# Patient Record
Sex: Male | Born: 1970 | Race: White | Hispanic: No | Smoking: Current every day smoker
Health system: Southern US, Community
[De-identification: ages and names within clinical notes are randomized; demographics above are authoritative.]

## PROBLEM LIST (undated history)

## (undated) DIAGNOSIS — F319 Bipolar disorder, unspecified: Secondary | ICD-10-CM

## (undated) DIAGNOSIS — F4310 Post-traumatic stress disorder, unspecified: Secondary | ICD-10-CM

## (undated) DIAGNOSIS — F32A Depression, unspecified: Secondary | ICD-10-CM

## (undated) DIAGNOSIS — F419 Anxiety disorder, unspecified: Secondary | ICD-10-CM

## (undated) DIAGNOSIS — I219 Acute myocardial infarction, unspecified: Secondary | ICD-10-CM

## (undated) DIAGNOSIS — Z950 Presence of cardiac pacemaker: Secondary | ICD-10-CM

## (undated) DIAGNOSIS — F329 Major depressive disorder, single episode, unspecified: Secondary | ICD-10-CM

## (undated) DIAGNOSIS — J45909 Unspecified asthma, uncomplicated: Secondary | ICD-10-CM

## (undated) HISTORY — PX: PACEMAKER INSERTION: SHX728

---

## 2016-11-06 ENCOUNTER — Emergency Department (HOSPITAL_COMMUNITY)
Admission: EM | Admit: 2016-11-06 | Discharge: 2016-11-07 | Disposition: A | Discharge status: Other Acute Inpatient Hospital | Payer: No Typology Code available for payment source | Attending: Emergency Medicine | Admitting: Emergency Medicine

## 2016-11-06 ENCOUNTER — Other Ambulatory Visit: Payer: Self-pay

## 2016-11-06 ENCOUNTER — Encounter (HOSPITAL_COMMUNITY): Payer: Self-pay | Admitting: Neurology

## 2016-11-06 ENCOUNTER — Emergency Department (HOSPITAL_COMMUNITY): Payer: Self-pay

## 2016-11-06 DIAGNOSIS — F17200 Nicotine dependence, unspecified, uncomplicated: Secondary | ICD-10-CM | POA: Insufficient documentation

## 2016-11-06 DIAGNOSIS — J45909 Unspecified asthma, uncomplicated: Secondary | ICD-10-CM | POA: Insufficient documentation

## 2016-11-06 DIAGNOSIS — R0789 Other chest pain: Secondary | ICD-10-CM

## 2016-11-06 DIAGNOSIS — I252 Old myocardial infarction: Secondary | ICD-10-CM | POA: Insufficient documentation

## 2016-11-06 DIAGNOSIS — F332 Major depressive disorder, recurrent severe without psychotic features: Secondary | ICD-10-CM | POA: Diagnosis present

## 2016-11-06 DIAGNOSIS — R45851 Suicidal ideations: Secondary | ICD-10-CM

## 2016-11-06 DIAGNOSIS — Z79899 Other long term (current) drug therapy: Secondary | ICD-10-CM | POA: Insufficient documentation

## 2016-11-06 DIAGNOSIS — Z950 Presence of cardiac pacemaker: Secondary | ICD-10-CM | POA: Insufficient documentation

## 2016-11-06 DIAGNOSIS — F329 Major depressive disorder, single episode, unspecified: Secondary | ICD-10-CM | POA: Insufficient documentation

## 2016-11-06 DIAGNOSIS — R079 Chest pain, unspecified: Secondary | ICD-10-CM

## 2016-11-06 HISTORY — DX: Presence of cardiac pacemaker: Z95.0

## 2016-11-06 HISTORY — DX: Bipolar disorder, unspecified: F31.9

## 2016-11-06 HISTORY — DX: Acute myocardial infarction, unspecified: I21.9

## 2016-11-06 HISTORY — DX: Anxiety disorder, unspecified: F41.9

## 2016-11-06 HISTORY — DX: Major depressive disorder, single episode, unspecified: F32.9

## 2016-11-06 HISTORY — DX: Post-traumatic stress disorder, unspecified: F43.10

## 2016-11-06 HISTORY — DX: Unspecified asthma, uncomplicated: J45.909

## 2016-11-06 HISTORY — DX: Depression, unspecified: F32.A

## 2016-11-06 LAB — RAPID URINE DRUG SCREEN, HOSP PERFORMED
Amphetamines: NOT DETECTED
BARBITURATES: NOT DETECTED
Benzodiazepines: NOT DETECTED
Cocaine: NOT DETECTED
Opiates: NOT DETECTED
TETRAHYDROCANNABINOL: NOT DETECTED

## 2016-11-06 LAB — ETHANOL

## 2016-11-06 LAB — CBC
HCT: 48.3 % (ref 39.0–52.0)
Hemoglobin: 15.7 g/dL (ref 13.0–17.0)
MCH: 30 pg (ref 26.0–34.0)
MCHC: 32.5 g/dL (ref 30.0–36.0)
MCV: 92.2 fL (ref 78.0–100.0)
Platelets: 183 10*3/uL (ref 150–400)
RBC: 5.24 MIL/uL (ref 4.22–5.81)
RDW: 13.9 % (ref 11.5–15.5)
WBC: 10 10*3/uL (ref 4.0–10.5)

## 2016-11-06 LAB — COMPREHENSIVE METABOLIC PANEL
ALT: 74 U/L — ABNORMAL HIGH (ref 17–63)
AST: 67 U/L — ABNORMAL HIGH (ref 15–41)
Albumin: 4.3 g/dL (ref 3.5–5.0)
Alkaline Phosphatase: 126 U/L (ref 38–126)
Anion gap: 7 (ref 5–15)
BILIRUBIN TOTAL: 0.6 mg/dL (ref 0.3–1.2)
BUN: 13 mg/dL (ref 6–20)
CO2: 29 mmol/L (ref 22–32)
CREATININE: 0.65 mg/dL (ref 0.61–1.24)
Calcium: 10.4 mg/dL — ABNORMAL HIGH (ref 8.9–10.3)
Chloride: 106 mmol/L (ref 101–111)
Glucose, Bld: 91 mg/dL (ref 65–99)
POTASSIUM: 5.4 mmol/L — AB (ref 3.5–5.1)
Sodium: 142 mmol/L (ref 135–145)
TOTAL PROTEIN: 7.9 g/dL (ref 6.5–8.1)

## 2016-11-06 LAB — ACETAMINOPHEN LEVEL: Acetaminophen (Tylenol), Serum: <10 ug/mL — ABNORMAL LOW (ref 10–30)

## 2016-11-06 LAB — I-STAT TROPONIN, ED
TROPONIN I, POC: 0 ng/mL (ref 0.00–0.08)
Troponin i, poc: 0 ng/mL (ref 0.00–0.08)

## 2016-11-06 LAB — SALICYLATE LEVEL

## 2016-11-06 MED ORDER — ASPIRIN 81 MG PO CHEW
81.0000 mg | CHEWABLE_TABLET | Freq: Every day | ORAL | Status: DC
  Administered 2016-11-07 (×2): 81 mg via ORAL
  Filled 2016-11-06 (×3): qty 1

## 2016-11-06 NOTE — ED Triage Notes (Signed)
Report given to EDP Schlossman. 

## 2016-11-06 NOTE — ED Triage Notes (Signed)
Meal ordered

## 2016-11-06 NOTE — ED Notes (Signed)
Staffing made aware of need for sitter 

## 2016-11-06 NOTE — ED Triage Notes (Signed)
Per ems- [facility] SI with plan to jump off bridge and is having chest wall pain. Chest pain when taking deep breath. Has pacemaker, and is supposed to be pacing but is not. 6/10 CP. VSS

## 2016-11-06 NOTE — ED Notes (Signed)
Pacemaker monitors called to verify why pacemaker was turned off. They will fax over information about his pacemaker. December 31st was last arhythmia

## 2016-11-06 NOTE — BHH Counselor (Signed)
Spoke with Dr. Dalene SeltzerSchlossman, EDP, at [facility]: Advised her of recommendation. She sts she agrees. TTS will seek outside placement.  Beryle FlockMary Georgina Krist, MS, CRC, [facility] Triage Specialist [facility]
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
## 2016-11-06 NOTE — BH Assessment (Signed)
Under Review: Lanny HurstBrynn Mar, Earlene Plateravis, [address], [place], [place], [facility], [place]

## 2016-11-07 ENCOUNTER — Inpatient Hospital Stay (HOSPITAL_COMMUNITY)
Admission: AD | Admit: 2016-11-07 | Discharge: 2016-11-20 | DRG: 885 | Disposition: A | Discharge status: Home / Self Care | Payer: No Typology Code available for payment source | Source: Intra-hospital | Attending: Psychiatry | Admitting: Psychiatry

## 2016-11-07 ENCOUNTER — Encounter (HOSPITAL_COMMUNITY): Payer: Self-pay

## 2016-11-07 DIAGNOSIS — J4520 Mild intermittent asthma, uncomplicated: Secondary | ICD-10-CM | POA: Diagnosis present

## 2016-11-07 DIAGNOSIS — F332 Major depressive disorder, recurrent severe without psychotic features: Secondary | ICD-10-CM

## 2016-11-07 DIAGNOSIS — F17210 Nicotine dependence, cigarettes, uncomplicated: Secondary | ICD-10-CM | POA: Diagnosis present

## 2016-11-07 DIAGNOSIS — Z79899 Other long term (current) drug therapy: Secondary | ICD-10-CM | POA: Diagnosis not present

## 2016-11-07 DIAGNOSIS — R45851 Suicidal ideations: Secondary | ICD-10-CM | POA: Diagnosis present

## 2016-11-07 DIAGNOSIS — I252 Old myocardial infarction: Secondary | ICD-10-CM

## 2016-11-07 DIAGNOSIS — F419 Anxiety disorder, unspecified: Secondary | ICD-10-CM | POA: Diagnosis present

## 2016-11-07 DIAGNOSIS — F4310 Post-traumatic stress disorder, unspecified: Secondary | ICD-10-CM | POA: Diagnosis present

## 2016-11-07 DIAGNOSIS — Z590 Homelessness: Secondary | ICD-10-CM

## 2016-11-07 DIAGNOSIS — Z7982 Long term (current) use of aspirin: Secondary | ICD-10-CM

## 2016-11-07 DIAGNOSIS — Z885 Allergy status to narcotic agent status: Secondary | ICD-10-CM

## 2016-11-07 DIAGNOSIS — Z886 Allergy status to analgesic agent status: Secondary | ICD-10-CM

## 2016-11-07 DIAGNOSIS — Z62810 Personal history of physical and sexual abuse in childhood: Secondary | ICD-10-CM | POA: Diagnosis present

## 2016-11-07 DIAGNOSIS — Z888 Allergy status to other drugs, medicaments and biological substances status: Secondary | ICD-10-CM | POA: Diagnosis not present

## 2016-11-07 DIAGNOSIS — G4700 Insomnia, unspecified: Secondary | ICD-10-CM | POA: Diagnosis present

## 2016-11-07 MED ORDER — HYDROXYZINE HCL 25 MG PO TABS
25.0000 mg | ORAL_TABLET | Freq: Three times a day (TID) | ORAL | Status: DC | PRN
  Administered 2016-11-08 – 2016-11-19 (×17): 25 mg via ORAL
  Filled 2016-11-07 (×20): qty 1

## 2016-11-07 MED ORDER — ALUM & MAG HYDROXIDE-SIMETH 200-200-20 MG/5ML PO SUSP
30.0000 mL | ORAL | Status: DC | PRN
Start: 2016-11-07 — End: 2016-11-20

## 2016-11-07 MED ORDER — NICOTINE 14 MG/24HR TD PT24
14.0000 mg | MEDICATED_PATCH | Freq: Once | TRANSDERMAL | Status: DC
  Administered 2016-11-07: 14 mg via TRANSDERMAL
  Filled 2016-11-07: qty 1

## 2016-11-07 MED ORDER — NICOTINE 21 MG/24HR TD PT24
21.0000 mg | MEDICATED_PATCH | Freq: Every day | TRANSDERMAL | Status: DC
  Administered 2016-11-07 – 2016-11-17 (×11): 21 mg via TRANSDERMAL
  Filled 2016-11-07 (×15): qty 1

## 2016-11-07 MED ORDER — MAGNESIUM HYDROXIDE 400 MG/5ML PO SUSP: 30.0000 mL | Freq: Every day | ORAL | Status: DC | PRN

## 2016-11-07 NOTE — ED Notes (Signed)
Regular diet was ordered for patient. 

## 2016-11-07 NOTE — Progress Notes (Signed)
D: Patient seen awake watching TV. Expresses feelings of wellness and belongingness after receiving call from father, mother, sister who he has not spoken with for the past 5 years, and girlfriend who left him. Verbalizes no concern. Denies pain, SI/HI, AH/VH at this time. No behavioral issues noted.   A: Staff offered support and encourage patient to continue with the treatment plan and verbalize needs to staff. Constant observation maintained except when patient is in the bathroom. Will continue to monitor patient.   R: Patient remains safe.

## 2016-11-07 NOTE — Congregational Nurse Program (Signed)
Congregational Nurse Program Note  Date of Encounter: 11/06/2016  Past Medical History: Past Medical History:  Diagnosis Date  . Anxiety   . Asthma   . Bipolar disorder (HCC)   . Depression   . MI (myocardial infarction)   . Pacemaker   . PTSD (post-traumatic stress disorder)     Encounter Details:     CNP Questionnaire - 11/06/16 1138      Patient Demographics   Is this a new or existing patient? New   Patient is considered a/an Not Applicable   Race Caucasian/White     Patient Assistance   Location of Patient Assistance Not Applicable   Patient's financial/insurance status Low Income;Self-Pay (Uninsured)   Uninsured Patient ([employer]ange Research officer, trade unionCard/Care Connects) Yes   Interventions Referred to ED/Urgent Care   Patient referred to apply for the following financial assistance Not Applicable   Food insecurities addressed Not Applicable   Transportation assistance No   Assistance securing medications No   Educational health offerings Behavioral health;Cardiac disease;Navigating the healthcare system     Encounter Details   Primary purpose of visit Chronic Illness/Condition Visit;Navigating the Healthcare System;Spiritual Care/Support Visit   Was an Emergency Department visit averted? No   Does patient have a medical provider? No   Patient referred to Emergency Department;Doctor referral for an emergent behavioral health crisis   Was a mental health screening completed? (GAINS tool) Yes   Was a mental health referral made? Yes   Does patient have dental issues? No   Does patient have vision issues? No   Does your patient have an abnormal blood pressure today? No   Since previous encounter, have you referred patient for abnormal blood pressure that resulted in a new diagnosis or medication change? No   Does your patient have an abnormal blood glucose today? No   Since previous encounter, have you referred patient for abnormal blood glucose that resulted in a new diagnosis or  medication change? No   Was there a life-saving intervention made? Yes     client was brought to me (behavioral health nurse) [facility].  Client was tearful and stating he wanted to die.  Assessment:  Tearful, states "I just can't do this anymore" referring to his chronic homelessness.  There were no shelter beds and his "[facility].  Client states he has "lost everything" and "nothing matters".  Sucidality:  States on a scale of 1-10 with 10 being the most likely to follow through with attempt he stated an "8".  He admitted to a plan of jumping off [address].  Has had suicide attempts in the past with hospitalizations.  Has been on medications:  Celexa and Visteril, however has not taken for about two years.  States has multiple cardiac issues including a pacemaker.  States was told by EMS a week ago he had had a heart attack, but he refused to go the hospital.  States has has chest pain since.  Client agreed to having EMS called and be transported to the Ed.  EMS [facility]

## 2016-11-07 NOTE — ED Notes (Signed)
Regular Diet has been ordered for Lunch. 

## 2016-11-07 NOTE — ED Provider Notes (Signed)
Vitals stable this am.  Plan on transfer to [facility]   Linwood DibblesJon Siyana Erney, MD 11/07/16 1125

## 2016-11-07 NOTE — ED Notes (Signed)
Pt belongings locked in cabinet. Inventory sheet placed in pt's chart

## 2016-11-07 NOTE — ED Notes (Signed)
Pt spoke w/step-father on phone - advised [facility]. Advised pt to [facility] so they may call and ask them to hold bed and if d/c'd tomorrow, he may go there.

## 2016-11-07 NOTE — ED Notes (Signed)
Pt aware Geoffrey Taylor, SW, [facility] aware of [facility] bed [facility] him.

## 2016-11-07 NOTE — ED Provider Notes (Signed)
[facility]-EM[facility]ENCY DEPT Provider Note   CSN: [id number] Arrival date & time: 11/06/16  1322     History   Chief Complaint Chief Complaint  Patient presents with  . Suicidal  . Chest Pain    HPI Gevork Ayyad is a 46 y.o. male.  HPI   Presents with concern for suicidal ideation. Reports that he's been under a lot of stress recently.  Reports he's had prior suicide attempts in the past. Reports that his planned this time would be to jump off of a bridge, and reports that he knows himself, and that he would do this. He also reports he's had chest pain that he describes as a dull ache across his whole chest, that has been intermittent over the last week. It is worse with taking a deep breath and movement. Denies shortness of breath, nausea, diaphoresis. Reports he's had pain in his bilateral lower extremities.  Past Medical History:  Diagnosis Date  . Anxiety   . Asthma   . Bipolar disorder (HCC)   . Depression   . MI (myocardial infarction)   . Pacemaker   . PTSD (post-traumatic stress disorder)     There are no active problems to display for this patient.   Past Surgical History:  Procedure Laterality Date  . PACEMAKER INSERTION         Home Medications    Prior to Admission medications   Not on File    Family History No family history on file.  Social History Social History  Substance Use Topics  . Smoking status: Current Every Day Smoker  . Smokeless tobacco: Never Used  . Alcohol use No     Allergies   Dilaudid [hydromorphone hcl]; Ibuprofen; and Vicodin [hydrocodone-acetaminophen]   Review of Systems Review of Systems  Constitutional: Negative for fever.  HENT: Negative for sore throat.   Eyes: Negative for visual disturbance.  Respiratory: Negative for shortness of breath.   Cardiovascular: Positive for chest pain.  Gastrointestinal: Negative for abdominal pain, nausea and vomiting.  Genitourinary: Negative for difficulty urinating.    Musculoskeletal: Negative for back pain and neck stiffness.  Skin: Negative for rash.  Neurological: Negative for syncope and headaches.  Psychiatric/Behavioral: Positive for suicidal ideas.     Physical Exam Updated Vital Signs BP 113/60 (BP Location: Right Arm)   Pulse 60   Temp 97.5 F (36.4 C) (Oral)   Resp 16   Ht 5' 6.5" (1.689 m)   Wt 158 lb (71.7 kg)   SpO2 97%   BMI 25.12 kg/m   Physical Exam  Constitutional: He is oriented to person, place, and time. He appears well-developed and well-nourished. No distress.  HENT:  Head: Normocephalic and atraumatic.  Eyes: Conjunctivae and EOM are normal.  Neck: Normal range of motion.  Cardiovascular: Normal rate, regular rhythm, normal heart sounds and intact distal pulses.  Exam reveals no gallop and no friction rub.   No murmur heard. Pulmonary/Chest: Effort normal and breath sounds normal. No respiratory distress. He has no wheezes. He has no rales. He exhibits tenderness.  Abdominal: Soft. He exhibits no distension. There is no tenderness. There is no guarding.  Musculoskeletal: He exhibits no edema.  Neurological: He is alert and oriented to person, place, and time.  Skin: Skin is warm and dry. He is not diaphoretic.  Nursing note and vitals reviewed.    ED Treatments / Results  Labs (all labs ordered are listed, but only abnormal results are displayed) Labs Reviewed  COMPREHENSIVE METABOLIC PANEL -  Abnormal; Notable for the following:       Result Value   Potassium 5.4 (*)    Calcium 10.4 (*)    AST 67 (*)    ALT 74 (*)    All other components within normal limits  ACETAMINOPHEN LEVEL - Abnormal; Notable for the following:    Acetaminophen (Tylenol), Serum <10 (*)    All other components within normal limits  CBC  ETHANOL  SALICYLATE LEVEL  RAPID URINE DRUG SCREEN, HOSP PERFORMED  I-STAT TROPOININ, ED  I-STAT TROPOININ, ED    EKG  EKG Interpretation  Date/Time:  Monday November 06 2016 13:21:33  EST Ventricular Rate:  68 PR Interval:  250 QRS Duration: 112 QT Interval:  374 QTC Calculation: 397 R Axis:   36 Text Interpretation:  Sinus rhythm with 1st degree A-V block Nonspecific ST abnormality Abnormal ECG No previous ECGs available Confirmed by [facility] MD, Denny PeonERIN ([id number]) on 11/06/2016 6:10:15 PM Also confirmed by [facility], Vinh Sachs ([id number])  on 11/07/2016 2:08:35 AM       Radiology Dg Chest 2 View  Result Date: 11/06/2016 CLINICAL DATA:  Central chest pain. EXAM: CHEST  2 VIEW COMPARISON:  None. FINDINGS: Trachea is midline. Pacemaker lead tips are in the right atrium and right ventricle. Lungs are clear. No pleural fluid. IMPRESSION: No acute findings. Electronically Signed   By: Leanna BattlesMelinda  Blietz M.D.   On: 11/06/2016 14:00    Procedures Procedures (including critical care time)  Medications Ordered in ED Medications  aspirin chewable tablet 81 mg (81 mg Oral Given 11/07/16 0039)  nicotine (NICODERM CQ - dosed in mg/24 hours) patch 14 mg (14 mg Transdermal Patch Applied 11/07/16 0052)     Initial Impression / Assessment and Plan / ED Course  I have reviewed the triage vital signs and the nursing notes.  Pertinent labs & imaging results that were available during my care of the patient were reviewed by me and considered in my medical decision making (see chart for details).     46 year old male with a history of MI, pacemaker placement, PTSD, depression asthma and bipolar presents with concern for suicidal ideation and chest pain. Patient also reports pain in his bilateral lower extremities. He has good pulses bilaterally, no asymmetric edema, not low suspicion for acute venous or arterial thrombus. There is no sign of cellulitis. Patient is perk negative, and have low suspicion for pulmonary embolus. His EKG without significant findings, and troponin 2 are negative. Patient's biggest concern today is his increasing suicidal ideation. His chest pain is overall atypical,  however recommend outpatient follow-up with his cardiologist.  Patient is medically cleared for psychiatric evaluation. TTS was consulted and recommended inpatient placement. Patient is voluntary, however would consider ivc if he would like to leave.  Final Clinical Impressions(s) / ED Diagnoses   Final diagnoses:  Suicidal ideation  Chest wall pain  Chest pain, unspecified type    New Prescriptions New Prescriptions   No medications on file     Alvira MondayErin Kerith Sherley, MD 11/07/16 [phone]

## 2016-11-07 NOTE — Progress Notes (Signed)
[facility] at 1250 ambulatory and alert. Pt presents in a calm and pleasant mood. States that he is homeless and depressed because his girlfriend left him to go live in [place] her son because she was tired of being homeless. Pt states that his plan is to jump off of a bridge. He endorses AH with voices telling him to kill himself and that he is worthless. He does admit to being very violent in the past but denies HI at this time. Also denies VH At this time pt is sleeping between intervals without incident. He is able to contract for safety.
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
## 2016-11-08 ENCOUNTER — Encounter (HOSPITAL_COMMUNITY): Payer: Self-pay

## 2016-11-08 DIAGNOSIS — F332 Major depressive disorder, recurrent severe without psychotic features: Secondary | ICD-10-CM | POA: Diagnosis not present

## 2016-11-08 DIAGNOSIS — Z79899 Other long term (current) drug therapy: Secondary | ICD-10-CM | POA: Diagnosis not present

## 2016-11-08 DIAGNOSIS — Z888 Allergy status to other drugs, medicaments and biological substances status: Secondary | ICD-10-CM

## 2016-11-08 DIAGNOSIS — F17210 Nicotine dependence, cigarettes, uncomplicated: Secondary | ICD-10-CM | POA: Diagnosis not present

## 2016-11-08 DIAGNOSIS — R45851 Suicidal ideations: Secondary | ICD-10-CM

## 2016-11-08 MED ORDER — HYDROXYZINE HCL 25 MG PO TABS: 25.0000 mg | ORAL_TABLET | Freq: Four times a day (QID) | ORAL | 0 refills | Status: DC | PRN

## 2016-11-08 MED ORDER — CITALOPRAM HYDROBROMIDE 10 MG PO TABS: 10.0000 mg | ORAL_TABLET | Freq: Every day | ORAL | 0 refills | Status: DC

## 2016-11-08 MED ORDER — ASPIRIN EC 81 MG PO TBEC: 81.0000 mg | DELAYED_RELEASE_TABLET | Freq: Every day | ORAL | 0 refills | Status: DC

## 2016-11-08 MED ORDER — NICOTINE 21 MG/24HR TD PT24: 21.0000 mg | MEDICATED_PATCH | Freq: Every day | TRANSDERMAL | 0 refills | Status: DC

## 2016-11-08 NOTE — Progress Notes (Signed)
11/08/2016  [facility]  Observation staff have made attempts to contact [facility] for bed placement for patient without success. [facility]. Pt and staff will continue [facility]. Pt will be re-assessed on 11/09/2016.  Laveda AbbeLaurie Britton Kainon Varady MSN, FNP [facility] 5:28PM

## 2016-11-08 NOTE — Progress Notes (Signed)
D:  Patient awake and alert; oriented x 4; he denies suicidal and homicidal ideation and AVH; no self-injurious behaviors noted or reported. A:  Medications given as scheduled;  Emotional support provided; encouraged him to seek assistance with needs/concerns. R:  Safety maintained on unit. 

## 2016-11-08 NOTE — Progress Notes (Signed)
[facility] OBSERVATION UNIT:  Family/Significant Other Suicide Prevention Education  Suicide Prevention Education:  Patient Refusal for Family/Significant Other Suicide Prevention Education: The patient Geoffrey Taylor has refused to provide written consent for family/significant other to be provided Family/Significant Other Suicide Prevention Education during admission and/or prior to discharge.  Physician notified.  Camelia EngKaren H Merlie Noga 11/08/2016, 9:17 AM   Camelia EngKaren H Rollins Wrightson, RN 11/08/16  9:17 AM

## 2016-11-08 NOTE — Discharge Summary (Signed)
[facility] Observation Unit Discharge Summary Note  Patient:  Geoffrey Taylor is an 46 y.o., male MRN:  [id number] DOB:  [birth year] Patient phone:  [phone] (home)  Patient address:   [address] [place] [address],  Total Time spent with patient: 20 minutes  Date of Admission:  11/07/2016 Date of Discharge: 11/08/2016  Reason for Admission:  Geoffrey Taylor is a 46 year old male who presented to the [facility], voluntarily, with suicidal ideation with a plan to jump off a bridge.   Principal Problem: MDD (major depressive disorder), recurrent severe, without psychosis [facility]) Discharge Diagnoses: Patient Active Problem List   Diagnosis Date Noted  . MDD (major depressive disorder), recurrent severe, without psychosis ([facility]) [F33.2] 11/07/2016    Priority: High    Past Psychiatric History: MDD, anxiety, PTSD,   Past Medical History:  Past Medical History:  Diagnosis Date  . Anxiety   . Asthma   . Bipolar disorder ([facility])   . Depression   . MI (myocardial infarction)   . Pacemaker   . PTSD (post-traumatic stress disorder)     Past Surgical History:  Procedure Laterality Date  . PACEMAKER INSERTION     Family History: History reviewed. No pertinent family history. Family Psychiatric  History: Unknown Social History:  History  Alcohol Use No     History  Drug Use No    Social History   Social History  . Marital status: Unknown    Spouse name: N/A  . Number of children: N/A  . Years of education: N/A   Occupational History  . Unemployed    Social History Main Topics  . Smoking status: Current Every Day Smoker    Packs/day: 1.00    Types: Cigarettes  . Smokeless tobacco: Never Used     Comment: Patient refused  . Alcohol use No  . Drug use: No  . Sexual activity: Not Currently    Partners: Female   Other Topics Concern  . None   Social History Narrative   Pt homeless at this time. Depressed over the fact that his girlfriend moved to [place] to live with her son  because she was also homeless here in [place] as well.    Hospital Course:  Pt spent the night in [facility] Observation unit without incident. Today,  Pt denies suicidal/homicidal ideation, denies auditory/visual hallucinations and does not appear to be responding to internal stimuli. Pt was calm and cooperative, alert & oriented x 4, dressed in paper scrubs and lying on the bed. Pt stated he felt hopeless and helpless and thought about ending it all but the more he thought about it he realized that suicide wasn't the answer. Pt states "My wife left me a week ago to go live with a family member because she is tired of being homeless. She is in [facility]. I didn't think there was anyone who cared about me but all of my family members have called me since I came to the hospital and they want to support me in getting help. I have a bed at the [facility] waiting on me which I hope works out and I can go there. " Pt appears motivated for change.    Physical Findings: AIMS: Facial and Oral Movements Muscles of Facial Expression: None, normal Lips and Perioral Area: None, normal Jaw: None, normal Tongue: None, normal,Extremity Movements Upper (arms, wrists, hands, fingers): None, normal Lower (legs, knees, ankles, toes): None, normal, Trunk Movements Neck, shoulders, hips: None, normal, Overall Severity Severity of abnormal  movements (highest score from questions above): None, normal Incapacitation due to abnormal movements: None, normal Patient's awareness of abnormal movements (rate only patient's report): No Awareness, Dental Status Current problems with teeth and/or dentures?: No Does patient usually wear dentures?: No  CIWA:    COWS:     Musculoskeletal: Strength & Muscle Tone: within normal limits Gait & Station: normal Patient leans: N/A  Psychiatric Specialty Exam: Physical Exam  Constitutional: He is oriented to person, place, and time. He appears well-developed and well-nourished.   Musculoskeletal: Normal range of motion.  Neurological: He is alert and oriented to person, place, and time.  Skin: Skin is warm and dry.    Review of Systems  Psychiatric/Behavioral: Positive for depression and suicidal ideas. Negative for hallucinations, memory loss and substance abuse. The patient is nervous/anxious. The patient does not have insomnia.   All other systems reviewed and are negative.   Blood pressure 108/69, pulse 72, temperature 98.4 F (36.9 C), temperature source Oral, resp. rate 16, height 5\' 7"  (1.702 m), weight 72.6 kg (160 lb), SpO2 99 %.Body mass index is 25.06 kg/m.  General Appearance: Casual  Eye Contact:  Good  Speech:  Clear and Coherent and Normal Rate  Volume:  Normal  Mood:  Anxious, Depressed, Hopeless and Worthless  Affect:  Appropriate, Congruent and Depressed  Thought Process:  Coherent, Goal Directed and Linear  Orientation:  Full (Time, Place, and Person)  Thought Content:  Logical  Suicidal Thoughts:  Yes.  without intent/plan  Homicidal Thoughts:  No  Memory:  Immediate;   Good Recent;   Good Remote;   Fair  Judgement:  Good  Insight:  Good  Psychomotor Activity:  Normal  Concentration:  Concentration: Good and Attention Span: Good  Recall:  Good  Fund of Knowledge:  Good  Language:  Good  Akathisia:  No  Handed:  Right  AIMS (if indicated):     Assets:  Communication Skills Desire for Improvement Housing Physical Health Resilience Social Support  ADL's:  Intact  Cognition:  WNL  Sleep:        Have you used any form of tobacco in the last 30 days? (Cigarettes, Smokeless Tobacco, Cigars, and/or Pipes): Yes  Has this patient used any form of tobacco in the last 30 days? (Cigarettes, Smokeless Tobacco, Cigars, and/or Pipes) Yes,Prescription provided for Nicoderm patches  Blood Alcohol level:  Lab Results  Component Value Date   ETH <5 11/06/2016    Metabolic Disorder Labs:  No results found for: HGBA1C, MPG No results  found for: PROLACTIN No results found for: CHOL, TRIG, HDL, CHOLHDL, VLDL, LDLCALC  See Psychiatric Specialty Exam and Suicide Risk Assessment completed by Attending Physician prior to discharge.  Discharge destination:  Other:  Holiday representative  Is patient on multiple antipsychotic therapies at discharge:  No   Has Patient had three or more failed trials of antipsychotic monotherapy by history:  No  Recommended Plan for Multiple Antipsychotic Therapies: NA   Allergies as of 11/08/2016      Reactions   Dilaudid [hydromorphone Hcl] Nausea And Vomiting   Ibuprofen Nausea And Vomiting   Vicodin [hydrocodone-acetaminophen] Nausea And Vomiting      Medication List    TAKE these medications     Indication  aspirin EC 81 MG tablet Take 1 tablet (81 mg total) by mouth daily.  Indication:  Acute Heart Attack   citalopram 10 MG tablet Commonly known as:  CELEXA Take 1 tablet (10 mg total) by mouth daily.  Indication:  Depression   hydrOXYzine 25 MG tablet Commonly known as:  ATARAX/VISTARIL Take 1 tablet (25 mg total) by mouth every 6 (six) hours as needed for anxiety.  Indication:  Anxiety Neurosis   nicotine 21 mg/24hr patch Commonly known as:  NICODERM CQ - dosed in mg/24 hours Place 1 patch (21 mg total) onto the skin daily. Start taking on:  11/09/2016  Indication:  Nicotine Addiction        Follow-up recommendations:  Activity:  as tolerated Diet:  Regular Other:  Follow up with outpatient resources for psychiatry and therapy and medication management [facility] Establish a relationship with A PCP when able for any existing or new medical problems/issues  Comments:  [facility]  Addendum: [facility]. Discharge order was discontinued and [facility] until 11/09/2016 at which time he will be discharged.  Signed: Laveda AbbeLaurie Britton Anthone Prieur,  NP 11/08/2016, 5:24 PM

## 2016-11-08 NOTE — Progress Notes (Signed)
DAR NOTE: Patient seen awake watching TV during the shift change. Verbalizes no concern. Denies pain, SI/HI, AH/VH at this time. No behavioral issues noted. Looking forward for his discharge tomorrow. Constant observation maintained except when patient is in the bathroom. Will continue to monitor patient. Patient remains safe.

## 2016-11-09 DIAGNOSIS — Z62810 Personal history of physical and sexual abuse in childhood: Secondary | ICD-10-CM | POA: Diagnosis present

## 2016-11-09 DIAGNOSIS — F332 Major depressive disorder, recurrent severe without psychotic features: Secondary | ICD-10-CM | POA: Diagnosis present

## 2016-11-09 DIAGNOSIS — R45851 Suicidal ideations: Secondary | ICD-10-CM | POA: Diagnosis not present

## 2016-11-09 DIAGNOSIS — Z79899 Other long term (current) drug therapy: Secondary | ICD-10-CM | POA: Diagnosis not present

## 2016-11-09 DIAGNOSIS — Z888 Allergy status to other drugs, medicaments and biological substances status: Secondary | ICD-10-CM | POA: Diagnosis not present

## 2016-11-09 DIAGNOSIS — F4310 Post-traumatic stress disorder, unspecified: Secondary | ICD-10-CM | POA: Diagnosis present

## 2016-11-09 DIAGNOSIS — Z950 Presence of cardiac pacemaker: Secondary | ICD-10-CM | POA: Diagnosis not present

## 2016-11-09 DIAGNOSIS — F17210 Nicotine dependence, cigarettes, uncomplicated: Secondary | ICD-10-CM | POA: Diagnosis present

## 2016-11-09 DIAGNOSIS — J4520 Mild intermittent asthma, uncomplicated: Secondary | ICD-10-CM | POA: Diagnosis present

## 2016-11-09 DIAGNOSIS — Z590 Homelessness: Secondary | ICD-10-CM | POA: Diagnosis not present

## 2016-11-09 DIAGNOSIS — Z885 Allergy status to narcotic agent status: Secondary | ICD-10-CM | POA: Diagnosis not present

## 2016-11-09 DIAGNOSIS — F419 Anxiety disorder, unspecified: Secondary | ICD-10-CM | POA: Diagnosis present

## 2016-11-09 DIAGNOSIS — I252 Old myocardial infarction: Secondary | ICD-10-CM | POA: Diagnosis not present

## 2016-11-09 DIAGNOSIS — G4700 Insomnia, unspecified: Secondary | ICD-10-CM | POA: Diagnosis present

## 2016-11-09 DIAGNOSIS — Z818 Family history of other mental and behavioral disorders: Secondary | ICD-10-CM | POA: Diagnosis not present

## 2016-11-09 DIAGNOSIS — Z886 Allergy status to analgesic agent status: Secondary | ICD-10-CM | POA: Diagnosis not present

## 2016-11-09 NOTE — Progress Notes (Signed)
Patient denied SI and HI, contracts for safety at this time.  Stated he does hear voices telling him he is no good.  Has weird dreams at night, no sleep last night.  Irritable, scared, nervous about the unknown, confused about everything concerning the relationship he is in.  Being homeless, has not talked to girlfriend in couple days.  Encouragement and emotional support given patient.  Safety maintained with 15 minute checks.

## 2016-11-09 NOTE — Progress Notes (Signed)
[facility] ambulatory, alert and without incident.  All admission documents signed. Pt given some personal items and checked for contraband. Orientated to the unit and report given to [facility], Nurse, learning disabilityN Safety maintained.

## 2016-11-09 NOTE — Progress Notes (Signed)
[facility]  11/09/2016 8:52 AM Geoffrey CodeJames Taylor  MRN:  [id number]   Subjective: Geoffrey Taylor is a 46 year old male who [facility] with suicidal ideation and a specific plan to jump from the [address] bridge and land on a car. HPI:  Pt was seen and chart reviewed.  Pt denies homicidal ideation, denies auditory/visual hallucinations and does not appear to be responding to internal stimuli. Today, Pt verbalized his fear that he will carry through on his plan upon discharge and feels that this is a real possibility. Pt stated the one thing that stopped him from jumping the other day was a visual image of carrying out his plan. Today, Pt stated he does not know if this would be enough to stop him again. Pt has no support system in this area. Pt stated that since being admitted to the hospital some of his immediate family have been in contact with him to offer encouragement. Pt's wife recently left [place] and [place] to live and Pt also has two children, ages [id number] and [id number], and two grandchildren who he has not seen in many years. Pt has many stressors leading to his suicidal ideation and expressed that he feels, at times,  he just wants to end it all. Pt would benefit from further monitoring of his depression, crisis stabilization and assistance with therapy/psychiatry and medication management. Pt's UDS and BAL were both negative on admission. Pt stated he needs help with his depression so he can try and figure out what is next for him.   Principal Problem: MDD (major depressive disorder), recurrent severe, without psychosis (HCC) Diagnosis:   Patient Active Problem List   Diagnosis Date Noted  . MDD (major depressive disorder), recurrent severe, without psychosis (HCC) [F33.2] 11/07/2016    Priority: High   Total Time spent with patient: 20 minutes  Past Psychiatric History: Depression, PTSD  Past Medical History:  Past Medical History:  Diagnosis Date   . Anxiety   . Asthma   . Bipolar disorder (HCC)   . Depression   . MI (myocardial infarction)   . Pacemaker   . PTSD (post-traumatic stress disorder)     Past Surgical History:  Procedure Laterality Date  . PACEMAKER INSERTION     Family History: History reviewed. No pertinent family history. Family Psychiatric  History: Unknown Social History:  History  Alcohol Use No     History  Drug Use No    Social History   Social History  . Marital status: Unknown    Spouse name: N/A  . Number of children: N/A  . Years of education: N/A   Occupational History  . Unemployed    Social History Main Topics  . Smoking status: Current Every Day Smoker    Packs/day: 1.00    Types: Cigarettes  . Smokeless tobacco: Never Used     Comment: Patient refused  . Alcohol use No  . Drug use: No  . Sexual activity: Not Currently    Partners: Female   Other Topics Concern  . None   Social History Narrative   Pt homeless at this time. Depressed over the fact that his girlfriend moved to [place] to live with her son because she was also homeless here in [place] as well.   Additional Social History:    Sleep: Fair  Appetite:  Fair  Current Medications: Current Facility-Administered Medications  Medication Dose Route Frequency Provider Last Rate  Last Dose  . alum & mag hydroxide-simeth (MAALOX/MYLANTA) 200-200-20 MG/5ML suspension 30 mL  30 mL Oral Q4H PRN Beau Fanny, FNP      . hydrOXYzine (ATARAX/VISTARIL) tablet 25 mg  25 mg Oral TID PRN Beau Fanny, FNP   25 mg at 11/08/16 2021  . magnesium hydroxide (MILK OF MAGNESIA) suspension 30 mL  30 mL Oral Daily PRN Beau Fanny, FNP      . nicotine (NICODERM CQ - dosed in mg/24 hours) patch 21 mg  21 mg Transdermal Daily Nelly Rout, MD   21 mg at 11/09/16 1610    Lab Results: No results found for this or any previous visit (from the past 48 hour(s)).  Blood Alcohol level:  Lab Results  Component Value Date   ETH <5  11/06/2016    Metabolic Disorder Labs: No results found for: HGBA1C, MPG No results found for: PROLACTIN No results found for: CHOL, TRIG, HDL, CHOLHDL, VLDL, LDLCALC  Physical Findings: AIMS: Facial and Oral Movements Muscles of Facial Expression: None, normal Lips and Perioral Area: None, normal Jaw: None, normal Tongue: None, normal,Extremity Movements Upper (arms, wrists, hands, fingers): None, normal Lower (legs, knees, ankles, toes): None, normal, Trunk Movements Neck, shoulders, hips: None, normal, Overall Severity Severity of abnormal movements (highest score from questions above): None, normal Incapacitation due to abnormal movements: None, normal Patient's awareness of abnormal movements (rate only patient's report): No Awareness, Dental Status Current problems with teeth and/or dentures?: No Does patient usually wear dentures?: No  CIWA:    COWS:     Musculoskeletal: Strength & Muscle Tone: within normal limits Gait & Station: normal Patient leans: N/A  Psychiatric Specialty Exam: Physical Exam  Review of Systems  Psychiatric/Behavioral: Positive for depression, substance abuse (History of ) and suicidal ideas. Negative for hallucinations and memory loss. The patient is nervous/anxious. The patient does not have insomnia.     Blood pressure 118/66, pulse 77, temperature 97.6 F (36.4 C), temperature source Oral, resp. rate 16, height 5\' 7"  (1.702 m), weight 72.6 kg (160 lb), SpO2 97 %.Body mass index is 25.06 kg/m.  General Appearance: Casual  Eye Contact:  Fair  Speech:  Clear and Coherent and Normal Rate  Volume:  Normal  Mood:  Anxious, Depressed and Hopeless  Affect:  Congruent, Depressed and Flat  Thought Process:  Coherent and Linear  Orientation:  Full (Time, Place, and Person)  Thought Content:  Logical  Suicidal Thoughts:  Yes.  without intent/plan  Homicidal Thoughts:  No  Memory:  Immediate;   Good Recent;   Good Remote;   Fair  Judgement:   Good  Insight:  Good  Psychomotor Activity:  Normal  Concentration:  Concentration: Good  Recall:  Good  Fund of Knowledge:  Good  Language:  Good  Akathisia:  No  Handed:  Right  AIMS (if indicated):     Assets:  Communication Skills Desire for Improvement Physical Health Resilience  ADL's:  Intact  Cognition:  WNL  Sleep:   Fair     Treatment Plan Summary: Daily contact with patient to assess and evaluate symptoms and progress in treatment and Medication management (see MAR)    Pt meets criteria for inpatient psychiatric admission.  Laveda Abbe, NP 11/09/2016, 8:52 AM

## 2016-11-09 NOTE — Plan of Care (Signed)
Problem: Education: Goal: Utilization of techniques to improve thought processes will improve Outcome: Progressing Nurse discussed depression/anxiety/coping skills with patient.    

## 2016-11-09 NOTE — Progress Notes (Signed)
Pt sleeping between intervals. Mood is depressed and anxious but cooperative. Pt denies HI/AVH/Pain but endorses SI with a plan to 'end all my problems'. Pt very talkative verbalizing that he is upset that he has not heard from his girlfriend in [place] and also worried that he his homeless with no place to go. Pt unable to contract for safety at this time but observation and monitoring is in progress.

## 2016-11-10 DIAGNOSIS — Z818 Family history of other mental and behavioral disorders: Secondary | ICD-10-CM

## 2016-11-10 MED ORDER — CITALOPRAM HYDROBROMIDE 20 MG PO TABS
20.0000 mg | ORAL_TABLET | Freq: Every day | ORAL | Status: DC
  Administered 2016-11-11 – 2016-11-13 (×3): 20 mg via ORAL
  Filled 2016-11-10 (×6): qty 1

## 2016-11-10 MED ORDER — CITALOPRAM HYDROBROMIDE 20 MG PO TABS
20.0000 mg | ORAL_TABLET | Freq: Every day | ORAL | Status: DC
  Administered 2016-11-10: 20 mg via ORAL
  Filled 2016-11-10 (×3): qty 1

## 2016-11-10 NOTE — Tx Team (Signed)
Initial Treatment Plan 11/10/2016 3:54 AM Geoffrey CodeJames Llerenas ZOX:[id number]    PATIENT STRESSORS: Financial difficulties Health problems Medication change or noncompliance Occupational concerns   PATIENT STRENGTHS: Ability for insight Active sense of humor Average or above average intelligence Capable of independent living Motivation for treatment/growth   PATIENT IDENTIFIED PROBLEMS: Suicide Risk  Depression  "get back on my medicine"  "discharge somewhere that can help me"               DISCHARGE CRITERIA:  Ability to meet basic life and health needs Adequate post-discharge living arrangements Improved stabilization in mood, thinking, and/or behavior Medical problems require only outpatient monitoring Motivation to continue treatment in a less acute level of care Need for constant or close observation no longer present Reduction of life-threatening or endangering symptoms to within safe limits Safe-care adequate arrangements made Verbal commitment to aftercare and medication compliance  PRELIMINARY DISCHARGE PLAN: Outpatient therapy  PATIENT/FAMILY INVOLVEMENT: This treatment plan has been presented to and reviewed with the patient, Geoffrey Taylor.  The patient and family have been given the opportunity to ask questions and make suggestions.  Ferrel LoganAmanda A Liborio Saccente, RN 11/10/2016, 3:54 AM

## 2016-11-10 NOTE — Plan of Care (Signed)
Problem: Activity: Goal: Interest or engagement in leisure activities will improve Outcome: Progressing Patient is attending groups, behaving appropriately and interacting with peers.  Problem: Health Behavior/Discharge Planning: Goal: Compliance with therapeutic regimen will improve Outcome: Progressing Patient taking medications as prescribed. Patient motivated to be involved in treatment. Patient educated about treatment regimen.

## 2016-11-10 NOTE — BHH Suicide Risk Assessment (Signed)
B[facility] INPATIENT:  Family/Significant Other Suicide Prevention Education  Suicide Prevention Education:  Patient Refusal for Family/Significant Other Suicide Prevention Education: The patient Geoffrey Taylor has refused to provide written consent for family/significant other to be provided Family/Significant Other Suicide Prevention Education during admission and/or prior to discharge.  Physician notified.  Baldo DaubJolan Marysue Fait 11/10/2016, 3:07 PM

## 2016-11-10 NOTE — Progress Notes (Signed)
Recreation Therapy Notes  Date:2/218 Time:0930 Location: 300 [address] Dayroom  Group Topic: Stress Management  Goal Area(s) Addresses:  Patient will verbalize importance of using healthy stress management.  Patient will identify positive emotions associated with healthy stress management.   Behavioral Response:Engaged  Intervention: Stress Management  Activity: Guided Imagery. LRT introduced the stress management techniques of guided imagery. LRT read a script for patients to engage in the technique. Patients were to follow along as LRT read the script.  Education:Stress Management, Discharge Planning.   Education Outcome:Acknowledges edcuation/In group clarification offered/Needs additional education  Clinical Observations/Feedback:Pt did attended group.    Caroll RancherMarjette Huldah Marin, LRT/CTRS        Caroll RancherLindsay, Lyna Laningham A 11/10/2016 12:47 PM
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
## 2016-11-10 NOTE — BHH Suicide Risk Assessment (Signed)
[facility] Admission Suicide Risk Assessment   Nursing information obtained from:    Demographic factors:    Current Mental Status:    Loss Factors:    Historical Factors:    Risk Reduction Factors:     Total Time spent with patient: 30 minutes Principal Problem: MDD (major depressive disorder), recurrent severe, without psychosis (HCC) Diagnosis:   Patient Active Problem List   Diagnosis Date Noted  . MDD (major depressive disorder), recurrent severe, without psychosis (HCC) [F33.2] 11/07/2016   Subjective Data:  46 yo Caucasian male, single, unemployed and homeless. 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 Voluntary presentation the the emergency room expressing hopelessness and worthlessness. Patient expressed feeling very depressed with associated thoughts of jumping [place]. Patient States that he used to live in [place] with his girlfriend. They had a stable relationship for three years. Patient states that due to financial constraints, they lost their apartment. Says his girlfriend left for [place]. Patient has been turned down by a shelter and his step-father. Says he has not slept in days. He has not had good meal in days. Patient says he kept thinking of ways of ending his life. He has worsening thoughts and decided to seek help. 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 Has a lot of will power and able to overcome the temptation of using again.  Last admission was over three years ago. This was in context of depression. He has never been manic in the past. No past suicidal attempt. No access to weapons. No hallucination in any modality. No abnormal belief system. No persecution. No overwhelming anxiety. Patient states that he had not taken his medications for three years because he could not afford it. He has requested that he be recommenced on Citalopram and Vistaril. No thoughts of harming others. No  past history of violence. No pending legal issues. No other stressors.  Continued Clinical Symptoms:  Alcohol Use Disorder Identification Test Final Score (AUDIT): 0 The "Alcohol Use Disorders Identification Test", Guidelines for Use in Primary Care, Second Edition.  World Science writer [facility]). Score between 0-7:  no or low risk or alcohol related problems. Score between 8-15:  moderate risk of alcohol related problems. Score between 16-19:  high risk of alcohol related problems. Score 20 or above:  warrants further diagnostic evaluation for alcohol dependence and treatment.   CLINICAL FACTORS:   As above   Musculoskeletal: Strength & Muscle Tone: within normal limits Gait & Station: normal Patient leans: N/A  Psychiatric Specialty Exam: Physical Exam  Constitutional: He is oriented to person, place, and time. He appears well-developed and well-nourished.  HENT:  Head: Normocephalic and atraumatic.  Eyes: Conjunctivae are normal. Pupils are equal, round, and reactive to light.  Neck: Normal range of motion. Neck supple.  Cardiovascular: Normal rate, regular rhythm and normal heart sounds.   Respiratory: Effort normal and breath sounds normal.  GI: Soft. Bowel sounds are normal.  Musculoskeletal: Normal range of motion.  Neurological: He is alert and oriented to person, place, and time. He has normal reflexes.  Skin: Skin is warm and dry.  Psychiatric:  As above    ROS  Blood pressure 103/65, pulse 70, temperature 97.6 F (36.4 C), temperature source Oral, resp. rate 16, height 5\' 7"  (1.702 m), weight 72.6 kg (160 lb), SpO2 97 %.Body mass index is 25.06 kg/m.  General Appearance: Withdrawn. Was in bed prior to interview. Says he  has not had the opportunity to sleep in days. Cooperative, good eye contact. Appropriate behavior and does not apperar internally stimulated.   Eye Contact:  Good  Speech:  Clear and Coherent and Normal Rate  Volume:  Normal  Mood:  Anxious and  Depressed  Affect:  Blunt and Congruent  Thought Process:  Goal Directed  Orientation:  Full (Time, Place, and Person)  Thought Content:  Hopelessness, worthlessness, emptiness. No delusional theme. No thoughts of violence towards others  Suicidal Thoughts:  Yes.  with intent/plan  Homicidal Thoughts:  No  Memory:  WNL  Judgement:  Poor  Insight:  Good  Psychomotor Activity:  Decreased  Concentration:  Attention Span: Fair  Recall:  Good  Fund of Knowledge:  Good  Language:  Good  Akathisia:  No  Handed:    AIMS (if indicated):     Assets:  Communication Skills Desire for Improvement  ADL's: Limited  Cognition:  WNL  Sleep:  Number of Hours: 4.75      COGNITIVE FEATURES THAT CONTRIBUTE TO RISK:  Closed-mindedness and Polarized thinking    SUICIDE RISK:   Severe:  Frequent, intense, and enduring suicidal ideation, specific plan, no subjective intent, but some objective markers of intent (i.e., choice of lethal method), the method is accessible, some limited preparatory behavior, evidence of impaired self-control, severe dysphoria/symptomatology, multiple risk factors present, and few if any protective factors, particularly a lack of social support.  PLAN OF CARE:  1. Suicide precautions 2. Recommence Citalopram and Hydroxyzine  3. Encourage group activation  4. Monitor mood behavior and interaction with peers 5. SW would facilitate safe discharge  I certify that inpatient services furnished can reasonably be expected to improve the patient's condition.   Georgiann CockerVincent A Izediuno, MD 11/10/2016, 4:09 PM

## 2016-11-10 NOTE — Tx Team (Signed)
Interdisciplinary Treatment and Diagnostic Plan Update  11/10/2016 Time of Session: 9:30AM Geoffrey CodeJames Zettel MRN: [id number]  Principal Diagnosis: MDD (major depressive disorder), recurrent severe, without psychosis (HCC)  Secondary Diagnoses: Principal Problem:   MDD (major depressive disorder), recurrent severe, without psychosis (HCC)   Current Medications:  Current Facility-Administered Medications  Medication Dose Route Frequency Provider Last Rate Last Dose  . alum & mag hydroxide-simeth (MAALOX/MYLANTA) 200-200-20 MG/5ML suspension 30 mL  30 mL Oral Q4H PRN Beau FannyJohn C Withrow, FNP      . hydrOXYzine (ATARAX/VISTARIL) tablet 25 mg  25 mg Oral TID PRN Beau FannyJohn C Withrow, FNP   25 mg at 11/10/16 0046  . magnesium hydroxide (MILK OF MAGNESIA) suspension 30 mL  30 mL Oral Daily PRN Beau FannyJohn C Withrow, FNP      . nicotine (NICODERM CQ - dosed in mg/24 hours) patch 21 mg  21 mg Transdermal Daily Nelly RoutArchana Kumar, MD   21 mg at 11/10/16 0831   PTA Medications: No prescriptions prior to admission.    Patient Stressors: Financial difficulties Health problems Medication change or noncompliance Occupational concerns  Patient Strengths: Ability for insight Active sense of humor Average or above average intelligence Capable of independent living Motivation for treatment/growth  Treatment Modalities: Medication Management, Group therapy, Case management,  1 to 1 session with clinician, Psychoeducation, Recreational therapy.   Physician Treatment Plan for Primary Diagnosis: MDD (major depressive disorder), recurrent severe, without psychosis (HCC) Long Term Goal(s):     Short Term Goals:    Medication Management: Evaluate patient's response, side effects, and tolerance of medication regimen.  Therapeutic Interventions: 1 to 1 sessions, Unit Group sessions and Medication administration.  Evaluation of Outcomes: Progressing  Physician Treatment Plan for Secondary Diagnosis: Principal Problem:  MDD (major depressive disorder), recurrent severe, without psychosis (HCC)  Long Term Goal(s):     Short Term Goals:       Medication Management: Evaluate patient's response, side effects, and tolerance of medication regimen.  Therapeutic Interventions: 1 to 1 sessions, Unit Group sessions and Medication administration.  Evaluation of Outcomes: Progressing   RN Treatment Plan for Primary Diagnosis: MDD (major depressive disorder), recurrent severe, without psychosis (HCC) Long Term Goal(s): Knowledge of disease and therapeutic regimen to maintain health will improve  Short Term Goals: Ability to remain free from injury will improve, Ability to disclose and discuss suicidal ideas and Ability to identify and develop effective coping behaviors will improve  Medication Management: RN will administer medications as ordered by provider, will assess and evaluate patient's response and provide education to patient for prescribed medication. RN will report any adverse and/or side effects to prescribing provider.  Therapeutic Interventions: 1 on 1 counseling sessions, Psychoeducation, Medication administration, Evaluate responses to treatment, Monitor vital signs and CBGs as ordered, Perform/monitor CIWA, COWS, AIMS and Fall Risk screenings as ordered, Perform wound care treatments as ordered.  Evaluation of Outcomes: Progressing   LCSW Treatment Plan for Primary Diagnosis: MDD (major depressive disorder), recurrent severe, without psychosis (HCC) Long Term Goal(s): Safe transition to appropriate next level of care at discharge, Engage patient in therapeutic group addressing interpersonal concerns.  Short Term Goals: Engage patient in aftercare planning with referrals and resources, Facilitate patient progression through stages of change regarding substance use diagnoses and concerns and Identify triggers associated with mental health/substance abuse issues  Therapeutic Interventions: Assess for  all discharge needs, 1 to 1 time with Social worker, Explore available resources and support systems, Assess for adequacy in community support network, Educate family and  significant other(s) on suicide prevention, Complete Psychosocial Assessment, Interpersonal group therapy.  Evaluation of Outcomes: Progressing   Progress in Treatment: Attending groups: Yes. Participating in groups: Yes. Taking medication as prescribed: Yes. Toleration medication: Yes. Family/Significant other contact made: No, will contact:  family member if patient consents Patient understands diagnosis: Yes. Discussing patient identified problems/goals with staff: Yes. Medical problems stabilized or resolved: Yes. Denies suicidal/homicidal ideation: No. Passive SI/Able to contract for safety.  Issues/concerns per patient self-inventory: No. Other: n/a  New problem(s) identified: No, Describe:  n/a  New Short Term/Long Term Goal(s): medication management; detox; development of comprehensive mental wellness/sobriety plan.   Discharge Plan or Barriers: CSW assessing for appropriate referrals. No current providers.   Reason for Continuation of Hospitalization: Aggression Depression Hallucinations Medication stabilization Withdrawal symptoms  Estimated Length of Stay: 3-5 days   Attendees: Patient: 11/10/2016 8:54 AM  Physician: Dr. Jackquline Berlin MD; Dr. Lucianne Muss MD 11/10/2016 8:54 AM  Nursing: Thelma Barge RN 11/10/2016 8:54 AM  RN Care Manager: Onnie Boer CM 11/10/2016 8:54 AM  Social Worker: Trula Slade, LCSW 11/10/2016 8:54 AM  Recreational Therapist:  11/10/2016 8:54 AM  Other: Armandina Stammer NP; Gray Bernhardt NP 11/10/2016 8:54 AM  Other:  11/10/2016 8:54 AM  Other: 11/10/2016 8:54 AM    Scribe for Treatment Team: Ledell Peoples Smart, LCSW 11/10/2016 8:54 AM

## 2016-11-10 NOTE — Plan of Care (Signed)
Problem: Activity: Goal: Imbalance in normal sleep/wake cycle will improve Outcome: Not Progressing Patient complaining of intense dreams. Patient stated tonight he woke from a "night terror".  Problem: Coping: Goal: Ability to verbalize feelings will improve Outcome: Progressing Patient able to discuss feelings with nurse. Patient able to talk openly about why he was admitted to the hospital and his current emotional status.
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
## 2016-11-10 NOTE — Progress Notes (Signed)
Patient attended wrap-up group and said that his day was 3. He was glad he woke up this morning.

## 2016-11-10 NOTE — Progress Notes (Signed)
Nursing Progress Note 7p-7a  D) Patient presents with anxiety and a flat affect. Patient is very tangential in his speech but pleasant and cooperative. Patient states he had "plans to jump off the bridge" but knew he needed help and came to the hospital. Patient states "I know the stress is bad for my heart, but I got denied disability and I am worried about where I will live". Patient states "my girlfriend says she was here for three months. I don't think I need to be here that long, but it will be more than 10 days I think". Patient states "I am worried about where I will discharge, but the social worker said she would help me". Patient complains of "an occasional odor around my groin that comes 2-3 hours after showering. It's embarrassing". Patient denies SI/HI/AVH or pain at this time. Patient contracts for safety on the unit. Patient states "I have been having intense dreams at night". Patient instructed to remove nicotine patch before bed. Patient states "I would like to be on celexa and vistaril. That combo really works for me". Patient is observed sitting in the dayroom during beginning of shift. Patient intermittently interacting with peers. Snacks and fluids provided. At 0045, patient came out of room and complained of anxiety. Patient states "I just woke up from a night terror. I need something to help me relax".   A) Emotional support given. 1:1 communication and active listening provided. Medications reviewed with patient. Patient encouraged to talk with provider tomorrow about medication needs. Patient medicated with PRN vistaril. Patient instructed to take nicotine patch off earlier tomorrow in the evening to prevent intense dreaming. Patient on q15 min safety checks. Opportunities for questions or concerns presented to patient. Patient encouraged to continue to work on treatment goals.   R) Patient receptive to interaction with nurse. Patient remains safe on the unit at this time. Patient is  resting in bed without further complaints. Will continue to monitor.

## 2016-11-10 NOTE — Progress Notes (Signed)
Nursing Progress Note 7p-7a  D) Patient presents with a flat affect that brightens upon approach. Patient states "I had a really great day". Patient endorsing anxiety about the future. Patient denies SI/HI/AVH or pain. Patient contracts for safety at this time. Patient without issues or concerns for Clinical research associatewriter. Patient observed getting snacks and fluids in the dayroom and attended group.  A) Emotional support given. Patient on q15 min safety checks. Opportunities for questions or concerns presented to patient. Patient encouraged to continue to work on treatment goals.  R) Patient receptive to interaction with nurse. Patient remains safe on the unit at this time. Patient is resting in bed without complaints. Will continue to monitor.

## 2016-11-10 NOTE — [facility] Group Notes (Signed)
[facility] LCSW Group Therapy  11/10/2016 4:08 PM  Type of Therapy:  Group Therapy  Participation Level:  Did Not Attend-pt invited. Chose to rest in room.   Summary of Progress/Problems: Feelings around Relapse. Group members discussed the meaning of relapse and shared personal stories of relapse, how it affected them and others, and how they perceived themselves during this time. Group members were encouraged to identify triggers, warning signs and coping skills used when facing the possibility of relapse. Social supports were discussed and explored in detail.   Wess Baney N Smart LCSW 11/10/2016, 4:08 PM

## 2016-11-10 NOTE — Progress Notes (Signed)
D: Patient up and visible in the milieu. Spoke with patient 1:1. Rates sleep as poor, appetite as poor, energy as low and concentration as poor. Patient's affect flat, anxious and depressed with congruent mood. Rating depression, hopelessness and anxiety all at an 8/10. States goal for today is to "feel better." Denies pain, complaining of sinus discomfort.   A: Medicated per orders, vistaril prn given. Emotional support offered and self inventory reviewed. Encouraged completion of Suicide Safety Plan. Discussed POC with MD, SW. Encouraged to speak with MD regarding congestion.  R: Patient verbalizes understanding of POC. On reassess, patient reports some decrease in anxiety. Patient denies SI/HI and remains safe on level III obs.

## 2016-11-11 NOTE — Progress Notes (Signed)
[facility] MD Progress Note  11/11/2016 4:36 PM Geoffrey Taylor  MRN:  [id number] Subjective:  46 yo Caucasian male, single, unemployed and homeless. 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 Voluntary presentation the the emergency room expressing hopelessness and worthlessness. Patient expressed feeling very depressed with associated thoughts of jumping [place].   Seen today. Was in bed prior to interview. States that he is still down. He has not seen any light at the end of the tunnel. Continues to feel worthless and hopeless. Says suicidal thoughts are still there. He has not been motivated to do anything here. Says all he wants to do is get into bed. He has been feeling anxious. Vistaril helps alleviate this.  Nursing staff notes he has been minimally interactive. He has been isolating self in his room. No behavioral issues so far. He has been tolerating his medications well.   Principal Problem: MDD (major depressive disorder), recurrent severe, without psychosis (HCC) Diagnosis:   Patient Active Problem List   Diagnosis Date Noted  . MDD (major depressive disorder), recurrent severe, without psychosis (HCC) [F33.2] 11/07/2016   Total Time spent with patient: 20 minutes  Past Psychiatric History: See H&P  Past Medical History:  Past Medical History:  Diagnosis Date  . Anxiety   . Asthma   . Bipolar disorder (HCC)   . Depression   . MI (myocardial infarction)   . Pacemaker   . PTSD (post-traumatic stress disorder)     Past Surgical History:  Procedure Laterality Date  . PACEMAKER INSERTION     Family History: History reviewed. No pertinent family history. Family Psychiatric  History: See H&P Social History:  History  Alcohol Use No     History  Drug Use No    Social History   Social History  . Marital status: Unknown    Spouse name: N/A  . Number of children: N/A  . Years of education: N/A    Occupational History  . Unemployed    Social History Main Topics  . Smoking status: Current Every Day Smoker    Packs/day: 1.00    Types: Cigarettes  . Smokeless tobacco: Never Used     Comment: Patient refused  . Alcohol use No  . Drug use: No  . Sexual activity: Not Currently    Partners: Female   Other Topics Concern  . None   Social History Narrative   Pt homeless at this time. Depressed over the fact that his girlfriend moved to [place] to live with her son because she was also homeless here in [place] as well.   Additional Social History:        Sleep: Fair  Appetite:  Fair  Current Medications: Current Facility-Administered Medications  Medication Dose Route Frequency Provider Last Rate Last Dose  . alum & mag hydroxide-simeth (MAALOX/MYLANTA) 200-200-20 MG/5ML suspension 30 mL  30 mL Oral Q4H PRN Beau Fanny, FNP      . citalopram (CELEXA) tablet 20 mg  20 mg Oral Daily Georgiann Cocker, MD   20 mg at 11/11/16 0801  . hydrOXYzine (ATARAX/VISTARIL) tablet 25 mg  25 mg Oral TID PRN Beau Fanny, FNP   25 mg at 11/11/16 1356  . magnesium hydroxide (MILK OF MAGNESIA) suspension 30 mL  30 mL Oral Daily PRN Beau Fanny, FNP      . nicotine (NICODERM CQ - dosed in mg/24 hours) patch 21 mg  21 mg Transdermal Daily  Nelly RoutArchana Kumar, MD   21 mg at 11/11/16 0801    Lab Results: No results found for this or any previous visit (from the past 48 hour(s)).  Blood Alcohol level:  Lab Results  Component Value Date   ETH <5 11/06/2016    Metabolic Disorder Labs: No results found for: HGBA1C, MPG No results found for: PROLACTIN No results found for: CHOL, TRIG, HDL, CHOLHDL, VLDL, LDLCALC  Physical Findings: AIMS: Facial and Oral Movements Muscles of Facial Expression: None, normal Lips and Perioral Area: None, normal Jaw: None, normal Tongue: None, normal,Extremity Movements Upper (arms, wrists, hands, fingers): None, normal Lower (legs, knees, ankles, toes):  None, normal, Trunk Movements Neck, shoulders, hips: None, normal, Overall Severity Severity of abnormal movements (highest score from questions above): None, normal Incapacitation due to abnormal movements: None, normal Patient's awareness of abnormal movements (rate only patient's report): No Awareness, Dental Status Current problems with teeth and/or dentures?: No Does patient usually wear dentures?: No  CIWA:  CIWA-Ar Total: 1 COWS:  COWS Total Score: 1  Musculoskeletal: Strength & Muscle Tone: within normal limits Gait & Station: normal Patient leans: N/A  Psychiatric Specialty Exam: Physical Exam  Constitutional: He appears well-developed and well-nourished.  HENT:  Head: Normocephalic and atraumatic.  Eyes: EOM are normal. Pupils are equal, round, and reactive to light.  Neck: Normal range of motion. Neck supple.  Cardiovascular: Normal rate, regular rhythm and normal heart sounds.   Respiratory: Effort normal and breath sounds normal.  Neurological: He is alert. He has normal reflexes.  Skin: Skin is warm and dry.  Psychiatric:  As above    ROS  Blood pressure 114/65, pulse 75, temperature 97.9 F (36.6 C), temperature source Oral, resp. rate 16, height 5\' 7"  (1.702 m), weight 72.6 kg (160 lb), SpO2 97 %.Body mass index is 25.06 kg/m.  General Appearance: Worried look, withdrawn, moderate rapport. Appropriate behavior.   Eye Contact:  Fair  Speech:  soft spoken  Volume:  Decreased  Mood:  Anxious and Depressed  Affect:  Blunt and Congruent  Thought Process:  Goal Directed  Orientation:  Full (Time, Place, and Person)  Thought Content:  Rumination  Suicidal Thoughts:  Yes.  without intent/plan  Homicidal Thoughts:  No  Memory:  Immediate;   Good Recent;   Good Remote;   Good  Judgement:  Fair  Insight:  Good  Psychomotor Activity:  Decreased  Concentration:  Attention Span: Fair  Recall:  Good  Fund of Knowledge:  Good  Language:  Good  Akathisia:  No   Handed:    AIMS (if indicated):     Assets:  Physical Health Resilience  ADL's:  Impaired  Cognition:  WNL  Sleep:  Number of Hours: 5.25     Treatment Plan Summary: Daily contact with patient to assess and evaluate symptoms and progress in treatment, Medication management and Plan to continue current regimen.  Georgiann CockerVincent A Izediuno, MD 11/11/2016, 4:36 PM

## 2016-11-11 NOTE — [facility] Group Notes (Signed)
[facility] Group Notes:  (Nursing/MHT/Case Management/Adjunct)  Date:  11/11/2016  Time:  1315  Type of Therapy:  Nurse Education - Suicide Safety Plan  Participation Level:  Active  Participation Quality:  Attentive  Affect:  Flat  Cognitive:  Alert  Insight:  Improving  Engagement in Group:  Engaged  Modes of Intervention:  Discussion, Education and Support  Summary of Progress/Problems: Writer led patients in exercise of completing SSP. Copy given to patient and filed on chart.    Merian CapronFriedman, Annakate Soulier [facility] 11/11/2016, 1400

## 2016-11-11 NOTE — Progress Notes (Signed)
Pt attended AA/NA group meeting. 

## 2016-11-11 NOTE — [facility] Group Notes (Signed)
[facility] LCSW Group Therapy Note  11/11/2016  and  10:00 AM  Type of Therapy and Topic:  Group Therapy: Avoiding Self-Sabotaging and Enabling Behaviors  Participation Level: Did not attend; invited to participate yet did not despite overhead announcement and encouragement by staff  Carrick Rijos C Kasiah Manka, LCSW 

## 2016-11-11 NOTE — Plan of Care (Signed)
Problem: Coping: Goal: Ability to cope will improve Outcome: Not Progressing Patient flat, depressed in affect and mood today. States, "today is just not a good day. I don't feel good." Isolative to room this AM.  Problem: Safety: Goal: Ability to disclose and discuss suicidal ideas will improve Outcome: Progressing Patient denies SI, thoughts to self harm today.

## 2016-11-11 NOTE — Progress Notes (Addendum)
D: Patient isolative to room this AM. States, "today is not as good of a day. I don't know what's wrong." Spoke with patient 1:1. Affect flat, mood depressed. Patient with minimal information forwarded and returned to bed after this interview. More active in milieu later in the day. Rates his sleep as fair, appetite as fair, energy as normal and concentration as good. Rates depression at a 5/10, hopelessness at a 3/10 and anxiety at a 2/10. Denies AVH.  Denies pain, physical problems.   A: Medicated per orders, no prns given or requested. Emotional support offered. Encouraged completion of Suicide Safety Plan and self inventory.   R: Patient completed both activities which are filed in chart. Patient verbalizes understanding of POC. Patient denies SI/HI and remains safe on level III obs.

## 2016-11-12 MED ORDER — TRAZODONE HCL 50 MG PO TABS
50.0000 mg | ORAL_TABLET | Freq: Every day | ORAL | Status: DC
  Administered 2016-11-12 – 2016-11-14 (×3): 50 mg via ORAL
  Filled 2016-11-12 (×5): qty 1

## 2016-11-12 MED ORDER — TRAZODONE HCL 50 MG PO TABS
ORAL_TABLET | ORAL | Status: AC
  Filled 2016-11-12: qty 1

## 2016-11-12 NOTE — Progress Notes (Signed)
D: Patient up and visible in the milieu. Spoke with patient 1:1. Rates sleep as fair, appetite as fair, energy as normal and concentration as good. Patient's affect anixious with congruent mood. Rating depression at a 4/10, hopelessness at a 4/10 and anxiety at a 6/10. States goal for today is to "stay calm and take meds." Reports AH which he says are "always there but they aren't improving this time. I feel some improvement from the meds but not with the voices. I mean, I know they aren't real but it gets old. They put me down and tell me I'm no good." Denies pain, physical problems.   A: Medicated per orders, vistaril prn admin. Emotional support offered and self inventory reviewed. Suicide Safety Plan on chart and patient given copy. Encouraged to speak with MD regarding medication concerns and with SW regarding discharge plan.  R: Patient verbalizes understanding of POC. States, "I'm just afraid if I go right back out on the streets, I'll end up back here. I"m hoping Herbert SetaHeather (SW) can [facility]." On reassess, patient calmer with vistaril and 1:1 support. Patient denies SI/HI and remains safe on level III obs.

## 2016-11-12 NOTE — Progress Notes (Signed)
D    Pt is pleasant and treatment compliant     He requested to get trazadone for sleep tonight   He said that helps him to sleep    He spent the evening in the dayroom watching the super bowl  A    Verbal support given   Medications administered and effectiveness monitored    Q 15 min checks R   Pt safe and receptive to verbal support

## 2016-11-12 NOTE — Plan of Care (Signed)
Problem: Education: Goal: Knowledge of the prescribed therapeutic regimen will improve Outcome: Progressing Patient verbalizes understanding of POC, medications.  Problem: Self-Concept: Goal: Level of anxiety will decrease Outcome: Not Progressing Patient remains quite anxious, primarily about discharge plan. Utilizing vistaril prn.

## 2016-11-12 NOTE — [facility] Group Notes (Signed)
   [facility] LCSW Group Therapy Note   11/12/2016  10:00 to 10:45 AM   Type of Therapy and Topic: Group Therapy: Feelings Around Returning Home & Establishing a Supportive Framework and Activity to Identify signs of Improvement or Decompensation   Participation Level: Active   Description of Group:  Patients first processed thoughts and feelings about up coming discharge. These included fears of upcoming changes, lack of change, new living environments, judgements and expectations from others and overall stigma of MH issues. We then discussed what is a supportive framework? What does it look like feel like and how do I discern it from and unhealthy non-supportive network? Learn how to cope when supports are not helpful and don't support you. Discuss what to do when your family/friends are not supportive.   Therapeutic Goals Addressed in Processing Group:  1. Patient will identify one healthy supportive network that they can use at discharge. 2. Patient will identify one factor of a supportive framework and how to tell it from an unhealthy network. 3. Patient able to identify one coping skill to use when they do not have positive supports from others. 4. Patient will demonstrate ability to communicate their needs through discussion and/or role plays.  Summary of Patient Progress:  Pt engaged easily during group session and required some redirection to avoid monopolizing the time. As patients processed their anxiety about discharge and described healthy supports patient spoke about 'wanting non judgemental supports.  Patient chose a visual to represent decompensation as homelessness  and improvement as freedom. Patient reports he is unsure if I really need to be here as I was simply overwhelmed.   Carney Bernatherine C Harrill, LCSW

## 2016-11-12 NOTE — Progress Notes (Signed)
D: Pt alert and oriented x4. Pt at the time of assessment complained of moderate depression, anxiety and moderate chronic back pain; "my back hurt more when I sit for a long time." Pt was flat and withdrawn to self even while in the dayroom. Pt denied SI, HI or AVH. Pt remained calm and cooperative. A: Medications offered as prescribed.  Support, encouragement, and safe environment provided.  15-minute safety checks continue. R: Pt was med compliant.  Pt attended AA group. Safety checks continue.

## 2016-11-12 NOTE — Progress Notes (Signed)
[facility]  11/12/2016 3:06 PM Geoffrey CodeJames Ignasiak  MRN:  [id number] Subjective:  46 yo Caucasian male, single, unemployed and homeless. 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 Voluntary presentation the the emergency room expressing hopelessness and worthlessness. Patient expressed feeling very depressed with associated thoughts of [place].   Today nursing staff reports that he stays in his room most of the time. He is not interacting much with peers. He is worried about the future.   At interview, patient says he continues to feel hopeless. Says he did not sleep well last night. No active thoughts of suicide. Has thoughts that life is no longer worth living. No associated psychosis. No thoughts of violence towards others. He is tolerating his medications well.   Principal Problem: MDD (major depressive disorder), recurrent severe, without psychosis (HCC) Diagnosis:   Patient Active Problem List   Diagnosis Date Noted  . MDD (major depressive disorder), recurrent severe, without psychosis (HCC) [F33.2] 11/07/2016   Total Time spent with patient: 15 minutes  Past Psychiatric History: See H&P  Past Medical History:  Past Medical History:  Diagnosis Date  . Anxiety   . Asthma   . Bipolar disorder (HCC)   . Depression   . MI (myocardial infarction)   . Pacemaker   . PTSD (post-traumatic stress disorder)     Past Surgical History:  Procedure Laterality Date  . PACEMAKER INSERTION     Family History: History reviewed. No pertinent family history. Family Psychiatric  History: See H&P Social History:  History  Alcohol Use No     History  Drug Use No    Social History   Social History  . Marital status: Unknown    Spouse name: N/A  . Number of children: N/A  . Years of education: N/A   Occupational History  . Unemployed    Social History Main Topics  . Smoking status: Current Every Day  Smoker    Packs/day: 1.00    Types: Cigarettes  . Smokeless tobacco: Never Used     Comment: Patient refused  . Alcohol use No  . Drug use: No  . Sexual activity: Not Currently    Partners: Female   Other Topics Concern  . None   Social History Narrative   Pt homeless at this time. Depressed over the fact that his girlfriend moved to [place] to live with her son because she was also homeless here in [place] as well.   Additional Social History:        Sleep: Fair  Appetite:  Fair  Current Medications: Current Facility-Administered Medications  Medication Dose Route Frequency Provider Last Rate Last Dose  . alum & mag hydroxide-simeth (MAALOX/MYLANTA) 200-200-20 MG/5ML suspension 30 mL  30 mL Oral Q4H PRN Beau FannyJohn C Withrow, FNP      . citalopram (CELEXA) tablet 20 mg  20 mg Oral Daily Georgiann CockerVincent A Jani Ploeger, MD   20 mg at 11/12/16 0750  . hydrOXYzine (ATARAX/VISTARIL) tablet 25 mg  25 mg Oral TID PRN Beau FannyJohn C Withrow, FNP   25 mg at 11/12/16 0751  . magnesium hydroxide (MILK OF MAGNESIA) suspension 30 mL  30 mL Oral Daily PRN Beau FannyJohn C Withrow, FNP      . nicotine (NICODERM CQ - dosed in mg/24 hours) patch 21 mg  21 mg Transdermal Daily Nelly RoutArchana Kumar, MD   21 mg at 11/12/16 0750    Lab Results: No results found for this  or any previous visit (from the past 48 hour(s)).  Blood Alcohol level:  Lab Results  Component Value Date   ETH <5 11/06/2016    Metabolic Disorder Labs: No results found for: HGBA1C, MPG No results found for: PROLACTIN No results found for: CHOL, TRIG, HDL, CHOLHDL, VLDL, LDLCALC  Physical Findings: AIMS: Facial and Oral Movements Muscles of Facial Expression: None, normal Lips and Perioral Area: None, normal Jaw: None, normal Tongue: None, normal,Extremity Movements Upper (arms, wrists, hands, fingers): None, normal Lower (legs, knees, ankles, toes): None, normal, Trunk Movements Neck, shoulders, hips: None, normal, Overall Severity Severity of abnormal  movements (highest score from questions above): None, normal Incapacitation due to abnormal movements: None, normal Patient's awareness of abnormal movements (rate only patient's report): No Awareness, Dental Status Current problems with teeth and/or dentures?: No Does patient usually wear dentures?: No  CIWA:  CIWA-Ar Total: 1 COWS:  COWS Total Score: 1  Musculoskeletal: Strength & Muscle Tone: within normal limits Gait & Station: normal Patient leans: N/A  Psychiatric Specialty Exam: Physical Exam  Constitutional: He appears well-developed and well-nourished.  HENT:  Head: Normocephalic and atraumatic.  Eyes: EOM are normal. Pupils are equal, round, and reactive to light.  Neck: Normal range of motion. Neck supple.  Cardiovascular: Normal rate, regular rhythm and normal heart sounds.   Respiratory: Effort normal and breath sounds normal.  Neurological: He is alert. He has normal reflexes.  Skin: Skin is warm and dry.  Psychiatric:  As above    ROS  Blood pressure 114/64, pulse 65, temperature 97.5 F (36.4 C), resp. rate 16, height 5\' 7"  (1.702 m), weight 72.6 kg (160 lb), SpO2 97 %.Body mass index is 25.06 kg/m.  General Appearance: Worried look, withdrawn, moderate rapport. Appropriate behavior.   Eye Contact:  Fair  Speech:  soft spoken  Volume:  Decreased  Mood:  Anxious and Depressed  Affect:  Blunt and Congruent  Thought Process:  Goal Directed  Orientation:  Full (Time, Place, and Person)  Thought Content:  Rumination  Suicidal Thoughts:  Yes.  without intent/plan  Homicidal Thoughts:  No  Memory:  Immediate;   Good Recent;   Good Remote;   Good  Judgement:  Fair  Insight:  Good  Psychomotor Activity:  Decreased  Concentration:  Attention Span: Fair  Recall:  Good  Fund of Knowledge:  Good  Language:  Good  Akathisia:  No  Handed:    AIMS (if indicated):     Assets:  Physical Health Resilience  ADL's:  Impaired  Cognition:  WNL  Sleep:  Number of  Hours: 6.25     Treatment Plan Summary: Patient is still depressed. He is still a risk to self. Would adjust his medication as listed below  Plan: 1. Increase Citalopram to 30 mg daily 2. Continue to encourage groups and unit activities 3. Continue to monitor mood, behavior and interactions with peers 4. SW would explore locally available resources  Georgiann Cocker, MD 11/12/2016, 3:06 PMPatient ID: Geoffrey Taylor, male   DOB: 09/17/1971, 46 y.o.   MRN: [id number]

## 2016-11-12 NOTE — [facility] Group Notes (Signed)
[facility] Group Notes:  (Nursing/MHT/Case Management/Adjunct)  Date:  11/12/2016  Time:  1315   Type of Therapy:  Nurse Education  Participation Level:  Did Not Attend  Participation Quality:    Affect:    Cognitive:    Insight:    Engagement in Group:    Modes of Intervention:    Summary of Progress/Problems: Patient invited however elected not to attend groups.  Merian CapronFriedman, Langford Carias [facility] 11/12/2016, 1400

## 2016-11-13 MED ORDER — CITALOPRAM HYDROBROMIDE 20 MG PO TABS
30.0000 mg | ORAL_TABLET | Freq: Every day | ORAL | Status: DC
  Administered 2016-11-14 – 2016-11-20 (×7): 30 mg via ORAL
  Filled 2016-11-13 (×9): qty 1

## 2016-11-13 NOTE — [facility] Group Notes (Signed)
[facility] LCSW Group Therapy  11/13/2016 1:25 PM  Type of Therapy:  Group Therapy  Participation Level:  Active  Summary of Progress/Problems: Today's Topic: Overcoming Obstacles. Patients identified one short term goal and potential obstacles in reaching this goal. Patients processed barriers involved in overcoming these obstacles. Patients identified steps necessary for overcoming these obstacles and explored motivation (internal and external) for facing these difficulties head on.   Ashly Goethe N Smart LCSW 11/13/2016, 1:25 PM

## 2016-11-13 NOTE — Progress Notes (Signed)
The patient attended this evening's A. A. Meeting and was appropriate.  

## 2016-11-13 NOTE — Progress Notes (Signed)
Recreation Therapy Notes  Date: 11/13/16 Time: 0930 Location: [address] Dayroom  Group Topic: Stress Management  Goal Area(s) Addresses:  Patient will verbalize importance of using healthy stress management.  Patient will identify positive emotions associated with healthy stress management.   Intervention: Stress Management  Activity :  Meditation.  LRT introduced the stress management technique of meditation.  LRT played Taylor meditation from the Calm app to give patients the opportunity to engage in the activity.  Patients were to follow along as LRT played the meditation.  Education:  Stress Management, Discharge Planning.   Education Outcome: Acknowledges edcuation/In group clarification offered/Needs additional education  Clinical Observations/Feedback: Pt did not attend group.    Harshal Sirmon, LRT/CTRS         Geoffrey Taylor 11/13/2016 3:30 PM 

## 2016-11-13 NOTE — Progress Notes (Signed)
DAR NOTE: Patient presents with anxious affect and depressed mood.  Denies pain, auditory and visual hallucinations.  Described energy level as low and concentration as good.  Rates depression at 2, hopelessness at 1, and anxiety at 3.  Maintained on routine safety checks.  Medications given as prescribed.  Support and encouragement offered as needed. States goal for today is "find housing."  Patient observed socializing with peers in the dayroom.  Patient remained isolative and withdrawn.  Offered no complaint.

## 2016-11-13 NOTE — [facility] Group Notes (Signed)
Pt attended spiritual care group on grief and loss facilitated by chaplain Geoffrey Taylor   Group opened with brief discussion and psycho-social ed around grief and loss in relationships and in relation to self - identifying life patterns, circumstances, changes that cause losses. Established group norm of speaking from own life experience. Group goal of establishing open and affirming space for members to share loss and experience with grief, normalize grief experience and provide psycho social education and grief support.      Geoffrey FearingJames was invited to group.  Did not attend.      Geoffrey Taylor, Geoffrey Taylor MDiv

## 2016-11-13 NOTE — Progress Notes (Signed)
[facility]pt left 2 messages regarding bed availability and [facility].  Trula SladeHeather Smart, MSW, [place] Clinical Social Worker 11/13/2016 4:09 PM

## 2016-11-13 NOTE — Progress Notes (Signed)
[facility]  11/13/2016 5:06 PM Geoffrey CodeJames Taylor  MRN:  [id number] Subjective:  46 yo Caucasian male, single, unemployed and homeless. 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 Voluntary presentation the the emergency room expressing hopelessness and worthlessness. Patient expressed feeling very depressed with associated thoughts of [place].   Seen today. Says [facility]. He is optimistic that they would take him. Says he feels good and has been out more. Patient states that his main worry has been appropriate shelter when he gets out of here. No thoughts of suicide. No thoughts of violence. No perceptual abnormality. Says he has not been ruminating as before today.  Nursing staff notes he is more interactive. He slept well last night. He is eating his meals . No behavioral issues. He is participating more with unit activities.   Principal Problem: MDD (major depressive disorder), recurrent severe, without psychosis (HCC) Diagnosis:   Patient Active Problem List   Diagnosis Date Noted  . MDD (major depressive disorder), recurrent severe, without psychosis (HCC) [F33.2] 11/07/2016   Total Time spent with patient: 15 minutes  Past Psychiatric History: See H&P  Past Medical History:  Past Medical History:  Diagnosis Date  . Anxiety   . Asthma   . Bipolar disorder (HCC)   . Depression   . MI (myocardial infarction)   . Pacemaker   . PTSD (post-traumatic stress disorder)     Past Surgical History:  Procedure Laterality Date  . PACEMAKER INSERTION     Family History: History reviewed. No pertinent family history. Family Psychiatric  History: See H&P Social History:  History  Alcohol Use No     History  Drug Use No    Social History   Social History  . Marital status: Unknown    Spouse name: N/A  . Number of children: N/A  . Years of education: N/A   Occupational  History  . Unemployed    Social History Main Topics  . Smoking status: Current Every Day Smoker    Packs/day: 1.00    Types: Cigarettes  . Smokeless tobacco: Never Used     Comment: Patient refused  . Alcohol use No  . Drug use: No  . Sexual activity: Not Currently    Partners: Female   Other Topics Concern  . None   Social History Narrative   Pt homeless at this time. Depressed over the fact that his girlfriend moved to [place] to live with her son because she was also homeless here in [place] as well.   Additional Social History:        Sleep: Good  Appetite:  Good  Current Medications: Current Facility-Administered Medications  Medication Dose Route Frequency Provider Last Rate Last Dose  . alum & mag hydroxide-simeth (MAALOX/MYLANTA) 200-200-20 MG/5ML suspension 30 mL  30 mL Oral Q4H PRN Beau FannyJohn C Withrow, FNP      . citalopram (CELEXA) tablet 20 mg  20 mg Oral Daily Georgiann CockerVincent A Laylee Schooley, MD   20 mg at 11/13/16 0809  . hydrOXYzine (ATARAX/VISTARIL) tablet 25 mg  25 mg Oral TID PRN Beau FannyJohn C Withrow, FNP   25 mg at 11/12/16 2130  . magnesium hydroxide (MILK OF MAGNESIA) suspension 30 mL  30 mL Oral Daily PRN Beau FannyJohn C Withrow, FNP      . nicotine (NICODERM CQ - dosed in mg/24 hours) patch 21 mg  21 mg Transdermal Daily Nelly RoutArchana Kumar, MD  21 mg at 11/13/16 0809  . traZODone (DESYREL) tablet 50 mg  50 mg Oral QHS Jackelyn Poling, NP   50 mg at 11/12/16 2130    Lab Results: No results found for this or any previous visit (from the past 48 hour(s)).  Blood Alcohol level:  Lab Results  Component Value Date   ETH <5 11/06/2016    Metabolic Disorder Labs: No results found for: HGBA1C, MPG No results found for: PROLACTIN No results found for: CHOL, TRIG, HDL, CHOLHDL, VLDL, LDLCALC  Physical Findings: AIMS: Facial and Oral Movements Muscles of Facial Expression: None, normal Lips and Perioral Area: None, normal Jaw: None, normal Tongue: None, normal,Extremity Movements Upper  (arms, wrists, hands, fingers): None, normal Lower (legs, knees, ankles, toes): None, normal, Trunk Movements Neck, shoulders, hips: None, normal, Overall Severity Severity of abnormal movements (highest score from questions above): None, normal Incapacitation due to abnormal movements: None, normal Patient's awareness of abnormal movements (rate only patient's report): No Awareness, Dental Status Current problems with teeth and/or dentures?: No Does patient usually wear dentures?: No  CIWA:  CIWA-Ar Total: 1 COWS:  COWS Total Score: 1  Musculoskeletal: Strength & Muscle Tone: within normal limits Gait & Station: normal Patient leans: N/A  Psychiatric Specialty Exam: Physical Exam  Constitutional: He appears well-developed and well-nourished.  HENT:  Head: Normocephalic and atraumatic.  Eyes: EOM are normal. Pupils are equal, round, and reactive to light.  Neck: Normal range of motion. Neck supple.  Cardiovascular: Normal rate, regular rhythm and normal heart sounds.   Respiratory: Effort normal and breath sounds normal.  Neurological: He is alert. He has normal reflexes.  Skin: Skin is warm and dry.  Psychiatric:  As above    ROS  Blood pressure 111/66, pulse 75, temperature 98.2 F (36.8 C), resp. rate (!) 21, height 5\' 7"  (1.702 m), weight 72.6 kg (160 lb), SpO2 97 %.Body mass index is 25.06 kg/m.  General Appearance: Pleasant, good psychomotor activity. Engaging well today. Mobilizing positive affect appropriately. Not internally disturbed.   Eye Contact:  Good  Speech:  Spontaneous. Normal prosody   Volume:  Normal  Mood:  Feeling better today  Affect:  Full range and appropriate  Thought Process:  Goal Directed  Orientation:  Full (Time, Place, and Person)  Thought Content:  More optimistic. Less negative rumination. No thoughts of violence.   Suicidal Thoughts:  No  Homicidal Thoughts:  No  Memory:  Immediate;   Good Recent;   Good Remote;   Good  Judgement:   Fair  Insight:  Good  Psychomotor Activity:  Normal  Concentration:  Good  Recall:  Good  Fund of Knowledge:  Good  Language:  Good  Akathisia:  No  Handed:    AIMS (if indicated):     Assets:  Physical Health Resilience  ADL's:  Better  Cognition:  WNL  Sleep:  Number of Hours: 6     Treatment Plan Summary: Depression is lifting. We are finalizing aftercare  Plan: 1. Continue current regimen 2. Continue to encourage groups and unit activities 3. Continue to monitor mood, behavior and interactions with peers 4. For discharge as soon as we secure safe placement.   Georgiann Cocker, MD 11/13/2016, 5:06 PMPatient ID: Geoffrey Taylor, male   DOB: [birth year], 46 y.o.   MRN: [id number] Patient ID: Sota Hetz, male   DOB: [birth year], 46 y.o.   MRN: [id number]

## 2016-11-14 DIAGNOSIS — Z950 Presence of cardiac pacemaker: Secondary | ICD-10-CM

## 2016-11-14 NOTE — [facility] Group Notes (Signed)
[facility] LCSW Group Therapy  11/14/2016 4:37 PM  Type of Therapy:  Group Therapy  Participation Level:  Active  Participation Quality:  Attentive  Affect:  Appropriate  Cognitive:  Alert and Oriented  Insight:  Improving  Engagement in Therapy:  Engaged  Modes of Intervention:  Confrontation, Discussion, Education, Socialization and Support  Summary of Progress/Problems: [facility] Speaker came to talk about his personal journey with substance abuse and addiction. The pt processed ways by which to relate to the speaker. MHA speaker provided handouts and educational information pertaining to groups and services offered by the [facility].   Ledell PeoplesHeather N Smart LCSW 11/14/2016, 4:37 PM

## 2016-11-14 NOTE — Progress Notes (Signed)
[facility]  11/14/2016 1:03 PM Geoffrey CodeJames Polsky  MRN:  [id number] Subjective:  Patient states that he is still depressed and hears the voices loudly.  He is anxious.    Objective:  46 yo Caucasian male, single, unemployed and homeless. Background history of MDD recurrent, Anxiety disorder.  He is hopeful that [facility] him as he has no place to go upon discharge.  Nursing staff notes he is more interactive. He slept well last night.  No behavioral issues. He is participating more with unit activities.   Principal Problem: MDD (major depressive disorder), recurrent severe, without psychosis (HCC) Diagnosis:   Patient Active Problem List   Diagnosis Date Noted  . MDD (major depressive disorder), recurrent severe, without psychosis (HCC) [F33.2] 11/07/2016   Total Time spent with patient: 15 minutes  Past Psychiatric History: See H&P  Past Medical History:  Past Medical History:  Diagnosis Date  . Anxiety   . Asthma   . Bipolar disorder (HCC)   . Depression   . MI (myocardial infarction)   . Pacemaker   . PTSD (post-traumatic stress disorder)     Past Surgical History:  Procedure Laterality Date  . PACEMAKER INSERTION     Family History: History reviewed. No pertinent family history. Family Psychiatric  History: See H&P Social History:  History  Alcohol Use No     History  Drug Use No    Social History   Social History  . Marital status: Unknown    Spouse name: N/A  . Number of children: N/A  . Years of education: N/A   Occupational History  . Unemployed    Social History Main Topics  . Smoking status: Current Every Day Smoker    Packs/day: 1.00    Types: Cigarettes  . Smokeless tobacco: Never Used     Comment: Patient refused  . Alcohol use No  . Drug use: No  . Sexual activity: Not Currently    Partners: Female   Other Topics Concern  . None   Social History Narrative   Pt homeless at this time. Depressed over the fact that his  girlfriend moved to [place] to live with her son because she was also homeless here in [place] as well.   Additional Social History:        Sleep: Good  Appetite:  Good  Current Medications: Current Facility-Administered Medications  Medication Dose Route Frequency Provider Last Rate Last Dose  . alum & mag hydroxide-simeth (MAALOX/MYLANTA) 200-200-20 MG/5ML suspension 30 mL  30 mL Oral Q4H PRN Beau FannyJohn C Withrow, FNP      . citalopram (CELEXA) tablet 30 mg  30 mg Oral Daily Georgiann CockerVincent A Izediuno, MD   30 mg at 11/14/16 0820  . hydrOXYzine (ATARAX/VISTARIL) tablet 25 mg  25 mg Oral TID PRN Beau FannyJohn C Withrow, FNP   25 mg at 11/13/16 2114  . magnesium hydroxide (MILK OF MAGNESIA) suspension 30 mL  30 mL Oral Daily PRN Beau FannyJohn C Withrow, FNP      . nicotine (NICODERM CQ - dosed in mg/24 hours) patch 21 mg  21 mg Transdermal Daily Nelly RoutArchana Kumar, MD   21 mg at 11/14/16 0820  . traZODone (DESYREL) tablet 50 mg  50 mg Oral QHS Jackelyn PolingJason A Berry, NP   50 mg at 11/13/16 2115    Lab Results: No results found for this or any previous visit (from the past 48 hour(s)).  Blood Alcohol level:  Lab Results  Component Value Date  ETH <5 11/06/2016    Metabolic Disorder Labs: No results found for: HGBA1C, MPG No results found for: PROLACTIN No results found for: CHOL, TRIG, HDL, CHOLHDL, VLDL, LDLCALC  Physical Findings: AIMS: Facial and Oral Movements Muscles of Facial Expression: None, normal Lips and Perioral Area: None, normal Jaw: None, normal Tongue: None, normal,Extremity Movements Upper (arms, wrists, hands, fingers): None, normal Lower (legs, knees, ankles, toes): None, normal, Trunk Movements Neck, shoulders, hips: None, normal, Overall Severity Severity of abnormal movements (highest score from questions above): None, normal Incapacitation due to abnormal movements: None, normal Patient's awareness of abnormal movements (rate only patient's report): No Awareness, Dental Status Current problems  with teeth and/or dentures?: No Does patient usually wear dentures?: No  CIWA:  CIWA-Ar Total: 1 COWS:  COWS Total Score: 1  Musculoskeletal: Strength & Muscle Tone: within normal limits Gait & Station: normal Patient leans: N/A  Psychiatric Specialty Exam: Physical Exam  Nursing note and vitals reviewed. Constitutional: He appears well-developed and well-nourished.  HENT:  Head: Normocephalic and atraumatic.  Eyes: EOM are normal. Pupils are equal, round, and reactive to light.  Neck: Normal range of motion. Neck supple.  Cardiovascular: Normal rate, regular rhythm and normal heart sounds.   Respiratory: Effort normal and breath sounds normal.  Neurological: He is alert. He has normal reflexes.  Skin: Skin is warm and dry.  Psychiatric:  As above    ROS  Blood pressure 103/62, pulse 91, temperature 97.9 F (36.6 C), temperature source Oral, resp. rate 18, height 5\' 7"  (1.702 m), weight 72.6 kg (160 lb), SpO2 97 %.Body mass index is 25.06 kg/m.  General Appearance: Pleasant, good psychomotor activity. Engaging well today. Mobilizing positive affect appropriately. Not internally disturbed.   Eye Contact:  Good  Speech:  Spontaneous. Normal prosody   Volume:  Normal  Mood:  Feeling better today  Affect:  Full range and appropriate  Thought Process:  Goal Directed  Orientation:  Full (Time, Place, and Person)  Thought Content:  More optimistic. Less negative rumination. No thoughts of violence.   Suicidal Thoughts:  No  Homicidal Thoughts:  No  Memory:  Immediate;   Good Recent;   Good Remote;   Good  Judgement:  Fair  Insight:  Good  Psychomotor Activity:  Normal  Concentration:  Good  Recall:  Good  Fund of Knowledge:  Good  Language:  Good  Akathisia:  No  Handed:    AIMS (if indicated):     Assets:  Physical Health Resilience  ADL's:  Better  Cognition:  WNL  Sleep:  Number of Hours: 6.5   Treatment Plan Summary: Review of chart, vital signs, medications,  and notes.  1-Individual and group therapy  2-Medication management for depression and anxiety: Medications reviewed with the patient. 3-Coping skills for depression, anxiety  4-Continue crisis stabilization and management  5-Address health issues--monitoring vital signs, stable  6-Treatment plan in progress to prevent relapse of depression and anxiety  Lindwood Qua, NP [facility] 11/14/2016, 1:03 PM   Agree with NP Progress Note

## 2016-11-14 NOTE — Progress Notes (Signed)
DAR NOTE: Patient presents with calm affect and  mood.  Denies pain, auditory and visual hallucinations.  Described energy level as normal and concentration as good.  Rates depression at 1, hopelessness at 1, and anxiety at 3.  Maintained on routine safety checks.  Medications given as prescribed.  Support and encouragement offered as needed.  Attended group and participated.  States goal for today is "finding housing."  Patient observed socializing with peers in the dayroom.  Patient is visible in the milieu for activities.  Offered no complaint.

## 2016-11-14 NOTE — Progress Notes (Signed)
Pt attend group. His day was 6. His goal find housing. He was able to find a place to live. Now he's coasting until time to leave.

## 2016-11-14 NOTE — Progress Notes (Signed)
D: Pt denies SI/HI/AVH. Pt is a little anxious. Pt goal for today is to work on finding housing once discharged. A: Pt was offered support and encouragement. Pt was given scheduled medications. Pt was encourage to attend groups. Q 15 minute checks were done for safety.  R:Pt attends groups and interacts well with peers and staff. Pt is taking medication. Pt has no complaints.Pt receptive to treatment and safety maintained on unit.

## 2016-11-14 NOTE — Progress Notes (Signed)
Recreation Therapy Notes  Animal-Assisted Activity (AAA) Program Checklist/Progress Notes Patient Eligibility Criteria Checklist & Daily Group note for Rec TxIntervention  Date: 02.06.2018 Time: 2:45pm Location: [address] Dayroom    AAA/T Program Assumption of Risk Form signed by Patient/ or Parent Legal Guardian Yes  Patient is free of allergies or sever asthma Yes  Patient reports no fear of animals Yes  Patient reports no history of cruelty to animals Yes  Patient understands his/her participation is voluntary Yes  Patient washes hands before animal contact Yes  Patient washes hands after animal contact Yes  Behavioral Response: Did not attend.   Geoffrey Taylor, LRT/CTRS        Geoffrey Taylor L 11/14/2016 3:08 PM 

## 2016-11-15 MED ORDER — BENZONATATE 100 MG PO CAPS
200.0000 mg | ORAL_CAPSULE | Freq: Three times a day (TID) | ORAL | Status: DC | PRN
  Administered 2016-11-15 – 2016-11-19 (×5): 200 mg via ORAL
  Filled 2016-11-15 (×5): qty 2

## 2016-11-15 MED ORDER — TRAZODONE HCL 100 MG PO TABS
100.0000 mg | ORAL_TABLET | Freq: Every day | ORAL | Status: DC
  Administered 2016-11-15 – 2016-11-19 (×5): 100 mg via ORAL
  Filled 2016-11-15 (×7): qty 1

## 2016-11-15 MED ORDER — GUAIFENESIN ER 600 MG PO TB12
1200.0000 mg | ORAL_TABLET | Freq: Two times a day (BID) | ORAL | Status: DC | PRN
Start: 2016-11-15 — End: 2016-11-20
  Administered 2016-11-15: 1200 mg via ORAL
  Filled 2016-11-15: qty 2

## 2016-11-15 NOTE — Progress Notes (Signed)
D: Patient up and visible in the milieu. Spoke with patient 1:1. Rates sleep as poor, appetite as fair, energy as normal and concentration as good. Patient's affect flat, mood anxious. "My depression is better but physically I don't feel well. I'm congested and my throat hurts. It really hurts when I'm coughing." Rating depression at a 2/10, hopelessness at a 2/10 and anxiety at a 4/10. States goal for today is to "not get depressed and stay calm."   A: Medicated per orders, cepacol given prn. Emotional support offered and self inventory reviewed. Encouraged to speak with MD regarding physical complaints. Suicide Safety Plan completed and on chart. Patient provided copy. Discussed POC with MD, SW.   R: Patient verbalizes understanding of POC. On reassess, patient patient reports minimal relief. Patient denies SI/HI and remains safe on level III obs.

## 2016-11-15 NOTE — [facility] Group Notes (Signed)
[facility] LCSW Group Therapy  11/15/2016 3:48 PM  Type of Therapy:  Group Therapy  Participation Level:  Active  Participation Quality:  Attentive  Affect:  Appropriate  Cognitive:  Alert and Oriented  Insight:  Engaged  Engagement in Therapy:  Engaged  Modes of Intervention:  Confrontation, Discussion, Education, Problem-solving, Socialization and Support  Summary of Progress/Problems: Emotion Regulation: This group focused on both positive and negative emotion identification and allowed group members to process ways to identify feelings, regulate negative emotions, and find healthy ways to manage internal/external emotions. Group members were asked to reflect on a time when their reaction to an emotion led to a negative outcome and explored how alternative responses using emotion regulation would have benefited them. Group members were also asked to discuss a time when emotion regulation was utilized when a negative emotion was experienced.   Geoffrey Taylor N Smart LCSW 11/15/2016, 3:48 PM

## 2016-11-15 NOTE — Progress Notes (Signed)
[facility] MD Progress Note  11/15/2016 2:20 PM Aldred Mase  MRN:  [id number] Subjective:  Spent time with Mr. Brazel as he recounted his recent history, struggle with housing and depression.  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  He shares that his depressive symptoms are improving with celexa, as they have in the past.  He is sleeping fairly well for the first part of the evening but continues with middle of night awakenings.  Discussed risks/benefits to increasing trazodone and he is agreeable to this.  He denies any active plan to harm himself.  He continues with passive SI at times but feels that things are more hopeful than when he was first admitted.   Medically, he is struggling with mild congestion and URI symptoms, worse in the evening, and requests a cough suppressant and decongestant for this.    Principal Problem: MDD (major depressive disorder), recurrent severe, without psychosis (HCC) Diagnosis:   Patient Active Problem List   Diagnosis Date Noted  . MDD (major depressive disorder), recurrent severe, without psychosis (HCC) [F33.2] 11/07/2016   Total Time spent with patient: 20 minutes  Past Psychiatric History: see H&P  Past Medical History:  Past Medical History:  Diagnosis Date  . Anxiety   . Asthma   . Bipolar disorder (HCC)   . Depression   . MI (myocardial infarction)   . Pacemaker   . PTSD (post-traumatic stress disorder)     Past Surgical History:  Procedure Laterality Date  . PACEMAKER INSERTION     Family History: History reviewed. No pertinent family history. Family Psychiatric  History: substance use  Social History:  History  Alcohol Use No     History  Drug Use No    Social History   Social History  . Marital status: Unknown    Spouse name: N/A  . Number of children: N/A  . Years of education: N/A   Occupational History  . Unemployed    Social History Main Topics  . Smoking status: Current Every Day Smoker    Packs/day: 1.00     Types: Cigarettes  . Smokeless tobacco: Never Used     Comment: Patient refused  . Alcohol use No  . Drug use: No  . Sexual activity: Not Currently    Partners: Female   Other Topics Concern  . None   Social History Narrative   Pt homeless at this time. Depressed over the fact that his girlfriend moved to [place] to live with her son because she was also homeless here in [place] as well.   Additional Social History:    Sleep: Fair  Appetite:  Fair  Current Medications: Current Facility-Administered Medications  Medication Dose Route Frequency Provider Last Rate Last Dose  . alum & mag hydroxide-simeth (MAALOX/MYLANTA) 200-200-20 MG/5ML suspension 30 mL  30 mL Oral Q4H PRN Beau Fanny, FNP      . citalopram (CELEXA) tablet 30 mg  30 mg Oral Daily Georgiann Cocker, MD   30 mg at 11/15/16 0810  . hydrOXYzine (ATARAX/VISTARIL) tablet 25 mg  25 mg Oral TID PRN Beau Fanny, FNP   25 mg at 11/14/16 2122  . magnesium hydroxide (MILK OF MAGNESIA) suspension 30 mL  30 mL Oral Daily PRN Beau Fanny, FNP      . nicotine (NICODERM CQ - dosed in mg/24 hours) patch 21 mg  21 mg Transdermal Daily Nelly Rout, MD   21 mg at 11/15/16 0810  . traZODone (DESYREL)  tablet 50 mg  50 mg Oral QHS Jackelyn PolingJason A Berry, NP   50 mg at 11/14/16 2122    Lab Results: No results found for this or any previous visit (from the past 48 hour(s)).  Blood Alcohol level:  Lab Results  Component Value Date   ETH <5 11/06/2016   Metabolic Disorder Labs: No results found for: HGBA1C, MPG No results found for: PROLACTIN No results found for: CHOL, TRIG, HDL, CHOLHDL, VLDL, LDLCALC  Physical Findings: AIMS: Facial and Oral Movements Muscles of Facial Expression: None, normal Lips and Perioral Area: None, normal Jaw: None, normal Tongue: None, normal,Extremity Movements Upper (arms, wrists, hands, fingers): None, normal Lower (legs, knees, ankles, toes): None, normal, Trunk Movements Neck, shoulders,  hips: None, normal, Overall Severity Severity of abnormal movements (highest score from questions above): None, normal Incapacitation due to abnormal movements: None, normal Patient's awareness of abnormal movements (rate only patient's report): No Awareness, Dental Status Current problems with teeth and/or dentures?: No Does patient usually wear dentures?: No  CIWA:  CIWA-Ar Total: 1 COWS:  COWS Total Score: 1  Musculoskeletal: Strength & Muscle Tone: within normal limits Gait & Station: normal Patient leans: N/A  Psychiatric Specialty Exam: Physical Exam  ROS  Blood pressure 119/73, pulse (!) 102, temperature 98.7 F (37.1 C), temperature source Oral, resp. rate 16, height 5\' 7"  (1.702 m), weight 72.6 kg (160 lb), SpO2 97 %.Body mass index is 25.06 kg/m.  General Appearance: Casual and Fairly Groomed  Eye Contact:  Fair  Speech:  Normal Rate  Volume:  Normal  Mood:  Euthymic  Affect:  Appropriate  Thought Process:  Goal Directed and Descriptions of Associations: Intact  Orientation:  Full (Time, Place, and Person)  Thought Content:  Logical  Suicidal Thoughts:  Yes.  without intent/plan  Homicidal Thoughts:  No  Memory:  Recent;   Good  Judgement:  Fair  Insight:  Fair  Psychomotor Activity:  Normal  Concentration:  Concentration: Good and Attention Span: Good  Recall:  Fair  Fund of Knowledge:  Good  Language:  Fair  Akathisia:  Negative  Handed:  Right  AIMS (if indicated):     Assets:  Communication Skills Desire for Improvement  ADL's:  Intact  Cognition:  WNL  Sleep:  Number of Hours: 6.5    Treatment Plan Summary: - Continue daily interviews with provider, participation in milieu activities and groups as tolerated, and provide psychoeducational material as indicated for coping strategies, medication education, and diagnostic clarity  Continue Celexa 30 mg daily for MDD with anxious features Continue Vistaril prn for breakthrough anxiety/panic Increase  trazodone to 100 mg QHS for sleep/insomnia Continue to coordinate with SW regarding disposition planning  Burnard LeighAlexander Arya Eksir, MD 11/15/2016, 2:20 PM

## 2016-11-15 NOTE — Progress Notes (Signed)
D: Pt +ve AH-command, pt contracts for safety  denies SI/HI/VH. Pt is pleasant and cooperative. Pt stated he was able to secure some place to live so, pt felt good. Pt was seen on the unit interacting with his peers.   A: Pt was offered support and encouragement. Pt was given scheduled medications. Pt was encourage to attend groups. Q 15 minute checks were done for safety.   R:Pt attends groups and interacts well with peers and staff. Pt is taking medication. Pt has no complaints at this time .Pt receptive to treatment and safety maintained on unit.

## 2016-11-15 NOTE — Progress Notes (Signed)
Patient ID: Geoffrey Taylor, male   DOB: 05/20/1971, 46 y.o.   MRN: [id number]   Pt currently presents with an appropriate affect and restless behavior. Pt reports to writer that their goal is to "get more sleep tonight." Pt states "I saw these flashing lights when I closed my eyes last night, it was freaky." Pt reports okay sleep with current medication regimen.   Pt provided with medications per providers orders. Pt's labs and vitals were monitored throughout the night. Pt given a 1:1 about emotional and mental status. Pt supported and encouraged to express concerns and questions. Pt educated on medications.  Pt's safety ensured with 15 minute and environmental checks. Pt currently denies SI/HI. Pt endorses AH of voices that are "loud, telling me that I am not good enough and shouldn't be here." Pt verbally agrees to seek staff if SI/HI occurs, if A/VH worsens and to consult with staff before acting on any harmful thoughts. Will continue POC.

## 2016-11-15 NOTE — Plan of Care (Signed)
Problem: Self-Concept: Goal: Level of anxiety will decrease Outcome: Progressing Patient rating anxiety at a 4/10 which is decreasing.

## 2016-11-16 MED ORDER — ALBUTEROL SULFATE HFA 108 (90 BASE) MCG/ACT IN AERS
2.0000 | INHALATION_SPRAY | Freq: Four times a day (QID) | RESPIRATORY_TRACT | Status: DC | PRN
  Administered 2016-11-16 – 2016-11-20 (×6): 2 via RESPIRATORY_TRACT
  Filled 2016-11-16: qty 6.7

## 2016-11-16 NOTE — Plan of Care (Signed)
Problem: Safety: Goal: Ability to identify and utilize support systems that promote safety will improve Outcome: Progressing Pt. was able to list a few family members as a support system that are dependable after d/c.

## 2016-11-16 NOTE — [facility] Group Notes (Signed)
[facility] LCSW Group Therapy  11/16/2016 2:59 PM  Type of Therapy:  Group Therapy  Participation Level:  Active  Participation Quality:  Attentive  Affect:  Appropriate  Cognitive:  Alert and Oriented  Insight:  Improving  Engagement in Therapy:  Engaged  Modes of Intervention:  Confrontation, Discussion, Education, Problem-solving, Socialization and Support  Summary of Progress/Problems: Today's Topic: Overcoming Obstacles. Patients identified one short term goal and potential obstacles in reaching this goal. Patients processed barriers involved in overcoming these obstacles. Patients identified steps necessary for overcoming these obstacles and explored motivation (internal and external) for facing these difficulties head on. Fayrene FearingJames was attentive and engaged during today's processing group. He shared that he is planning [facility] and is hoping that his wife will return from out of state once he finds secure housing. Patient is working with CSW and the [facility] program to make this a reality. He reports that although he is feeling physically ill, his mental health is improving each day. "I'm really pleased with my medications right now and how they are helping me. "Fayrene FearingJames continues to be somewhat monopolizing in the group setting but is redirectable. He is supportive of others and remains pleasant/cooperative. Some pressured speech but remains on topic.   Ninoshka Wainwright N Smart LCSW 11/16/2016, 2:59 PM

## 2016-11-16 NOTE — Progress Notes (Signed)
Adult Psychoeducational Group Note  Date:  11/16/2016 Time:  9:06 PM  Group Topic/Focus:  Wrap-Up Group:   The focus of this group is to help patients review their daily goal of treatment and discuss progress on daily workbooks.  Participation Level:  Active  Participation Quality:  Appropriate  Affect:  Appropriate  Cognitive:  Appropriate  Insight: Appropriate  Engagement in Group:  Engaged  Modes of Intervention:  Discussion  Additional Comments:  The patient attended wrap up group and expressed that he had a good day.  Octavio Mannshigpen, Amanuel Sinkfield Lee 11/16/2016, 9:06 PM

## 2016-11-16 NOTE — Progress Notes (Signed)
D:Pt has been out in the dayroom interacting with peers. Pt has c/o of a cough and was given a prn inhaler and cough medication. Pt said that he had a hx of four heart attacks and a pacemaker. He said that his [place] to be went to [place] until he finds a place to live. Pt is pleasant and cooperative looking forward to being discharged this weekend.  A:Offered support, encouragement and 15 minute checks.  R:Pt denies si and hi. Safety maintained on the unit.

## 2016-11-16 NOTE — Tx Team (Signed)
Interdisciplinary Treatment and Diagnostic Plan Update  11/16/2016 Time of Session: 9:30AM Geoffrey Taylor MRN: [id number]  Principal Diagnosis: MDD (major depressive disorder), recurrent severe, without psychosis (HCC)  Secondary Diagnoses: Principal Problem:   MDD (major depressive disorder), recurrent severe, without psychosis (HCC)   Current Medications:  Current Facility-Administered Medications  Medication Dose Route Frequency Provider Last Rate Last Dose  . alum & mag hydroxide-simeth (MAALOX/MYLANTA) 200-200-20 MG/5ML suspension 30 mL  30 mL Oral Q4H PRN Beau Fanny, FNP      . benzonatate (TESSALON) capsule 200 mg  200 mg Oral TID PRN Kerry Hough, PA-C   200 mg at 11/15/16 2257  . citalopram (CELEXA) tablet 30 mg  30 mg Oral Daily Georgiann Cocker, MD   30 mg at 11/16/16 0825  . guaiFENesin (MUCINEX) 12 hr tablet 1,200 mg  1,200 mg Oral BID PRN Kerry Hough, PA-C   1,200 mg at 11/15/16 2257  . hydrOXYzine (ATARAX/VISTARIL) tablet 25 mg  25 mg Oral TID PRN Beau Fanny, FNP   25 mg at 11/15/16 2125  . magnesium hydroxide (MILK OF MAGNESIA) suspension 30 mL  30 mL Oral Daily PRN Beau Fanny, FNP      . nicotine (NICODERM CQ - dosed in mg/24 hours) patch 21 mg  21 mg Transdermal Daily Nelly Rout, MD   21 mg at 11/16/16 0825  . traZODone (DESYREL) tablet 100 mg  100 mg Oral QHS Burnard Leigh, MD   100 mg at 11/15/16 2125   PTA Medications: No prescriptions prior to admission.    Patient Stressors: Financial difficulties Health problems Medication change or noncompliance Occupational concerns  Patient Strengths: Ability for insight Active sense of humor Average or above average intelligence Capable of independent living Motivation for treatment/growth  Treatment Modalities: Medication Management, Group therapy, Case management,  1 to 1 session with clinician, Psychoeducation, Recreational therapy.   Physician Treatment Plan for Primary Diagnosis:  MDD (major depressive disorder), recurrent severe, without psychosis (HCC) Long Term Goal(s): Improvement in symptoms so as ready for discharge Improvement in symptoms so as ready for discharge   Short Term Goals: Ability to identify changes in lifestyle to reduce recurrence of condition will improve Ability to verbalize feelings will improve Ability to disclose and discuss suicidal ideas Ability to demonstrate self-control will improve Ability to identify changes in lifestyle to reduce recurrence of condition will improve Ability to identify and develop effective coping behaviors will improve Compliance with prescribed medications will improve Ability to identify triggers associated with substance abuse/mental health issues will improve  Medication Management: Evaluate patient's response, side effects, and tolerance of medication regimen.  Therapeutic Interventions: 1 to 1 sessions, Unit Group sessions and Medication administration.  Evaluation of Outcomes: Progressing  Physician Treatment Plan for Secondary Diagnosis: Principal Problem:   MDD (major depressive disorder), recurrent severe, without psychosis (HCC)  Long Term Goal(s): Improvement in symptoms so as ready for discharge Improvement in symptoms so as ready for discharge   Short Term Goals: Ability to identify changes in lifestyle to reduce recurrence of condition will improve Ability to verbalize feelings will improve Ability to disclose and discuss suicidal ideas Ability to demonstrate self-control will improve Ability to identify changes in lifestyle to reduce recurrence of condition will improve Ability to identify and develop effective coping behaviors will improve Compliance with prescribed medications will improve Ability to identify triggers associated with substance abuse/mental health issues will improve     Medication Management: Evaluate patient's response, side effects,  and tolerance of medication  regimen.  Therapeutic Interventions: 1 to 1 sessions, Unit Group sessions and Medication administration.  Evaluation of Outcomes: Progressing   RN Treatment Plan for Primary Diagnosis: MDD (major depressive disorder), recurrent severe, without psychosis (HCC) Long Term Goal(s): Knowledge of disease and therapeutic regimen to maintain health will improve  Short Term Goals: Ability to remain free from injury will improve, Ability to disclose and discuss suicidal ideas and Ability to identify and develop effective coping behaviors will improve  Medication Management: RN will administer medications as ordered by provider, will assess and evaluate patient's response and provide education to patient for prescribed medication. RN will report any adverse and/or side effects to prescribing provider.  Therapeutic Interventions: 1 on 1 counseling sessions, Psychoeducation, Medication administration, Evaluate responses to treatment, Monitor vital signs and CBGs as ordered, Perform/monitor CIWA, COWS, AIMS and Fall Risk screenings as ordered, Perform wound care treatments as ordered.  Evaluation of Outcomes: Progressing   LCSW Treatment Plan for Primary Diagnosis: MDD (major depressive disorder), recurrent severe, without psychosis (HCC) Long Term Goal(s): Safe transition to appropriate next level of care at discharge, Engage patient in therapeutic group addressing interpersonal concerns.  Short Term Goals: Engage patient in aftercare planning with referrals and resources, Facilitate patient progression through stages of change regarding substance use diagnoses and concerns and Identify triggers associated with mental health/substance abuse issues  Therapeutic Interventions: Assess for all discharge needs, 1 to 1 time with Social worker, Explore available resources and support systems, Assess for adequacy in community support network, Educate family and significant other(s) on suicide prevention, Complete  Psychosocial Assessment, Interpersonal group therapy.  Evaluation of Outcomes: Progressing   Progress in Treatment: Attending groups: Yes. Participating in groups: Yes. Taking medication as prescribed: Yes. Toleration medication: Yes. Family/Significant other contact made: SPE completed with pt; pt declined to consent to family contact.  Patient understands diagnosis: Yes. Discussing patient identified problems/goals with staff: Yes. Medical problems stabilized or resolved: Yes. Denies suicidal/homicidal ideation: Yes, self report.  Issues/concerns per patient self-inventory: No. Other: n/a  New problem(s) identified: No, Describe:  n/a  New Short Term/Long Term Goal(s): medication management; detox; development of comprehensive mental wellness/sobriety plan. Auditory hallucinations--elimination or decrease in these symptoms.   Discharge Plan or Barriers: Pt declined inpatient referrals. Referred to [facility] program and assessed on 11/14/16 by Lilli Fewiffany Dumas ([facility]). [facility] and to fill medications due to homelessness and no insurance. [facility] and was receptive to mental health association and NA information pamphlets.   Reason for Continuation of Hospitalization:  Depression Hallucinations Medication stabilization  Estimated Length of Stay:1-3 days  Attendees: Patient: 11/16/2016 10:14 AM  Physician: Dr. Rene KocherEksir MD 11/16/2016 10:14 AM  Nursing: Zenovia JarredKaren, Jan RN 11/16/2016 10:14 AM  RN Care Manager: Onnie BoerJennifer Clark CM 11/16/2016 10:14 AM  Social Worker: Chartered loss adjusterHeather Smart, LCSW 11/16/2016 10:14 AM  Recreational Therapist:  11/16/2016 10:14 AM  Other: Armandina StammerAgnes Nwoko NP; May Augustin NP 11/16/2016 10:14 AM  Other:  11/16/2016 10:14 AM  Other: 11/16/2016 10:14 AM    Scribe for Treatment Team: Ledell PeoplesHeather N Smart, LCSW 11/16/2016 10:14 AM

## 2016-11-16 NOTE — Progress Notes (Signed)
[facility] MD Progress Note  11/16/2016 2:53 PM Geoffrey Taylor  MRN:  [id number] Subjective:  Patient reports that his cough has continued. He has a history of intermittent asthma.  Agrees to albuterol inhaler use.  He reports that his sleep last night was not very good because of continued coughing.  He was able to get few hours of rest.  Shares that his mood is "better" with celexa.  Discussing plans for the future, he is eager to rejoin the community but states that if he is anxious about finding consistent housing. Shares that "I will be right back here being suicidal" if he doesn't have a place to go.  He is hopeful that [employer] will be able to accept him for housing.  He has not other concerns or needs at this time.   Principal Problem: MDD (major depressive disorder), recurrent severe, without psychosis (HCC) Diagnosis:   Patient Active Problem List   Diagnosis Date Noted  . MDD (major depressive disorder), recurrent severe, without psychosis (HCC) [F33.2] 11/07/2016   Total Time spent with patient: 15 minutes  Past Psychiatric History: See H&P for full details  Past Medical History:  Past Medical History:  Diagnosis Date  . Anxiety   . Asthma   . Bipolar disorder (HCC)   . Depression   . MI (myocardial infarction)   . Pacemaker   . PTSD (post-traumatic stress disorder)     Past Surgical History:  Procedure Laterality Date  . PACEMAKER INSERTION     Family History: History reviewed. No pertinent family history. Family Psychiatric  History: See H&P for full details  Social History:  History  Alcohol Use No     History  Drug Use No    Social History   Social History  . Marital status: Unknown    Spouse name: N/A  . Number of children: N/A  . Years of education: N/A   Occupational History  . Unemployed    Social History Main Topics  . Smoking status: Current Every Day Smoker    Packs/day: 1.00    Types: Cigarettes  . Smokeless tobacco: Never Used     Comment:  Patient refused  . Alcohol use No  . Drug use: No  . Sexual activity: Not Currently    Partners: Female   Other Topics Concern  . None   Social History Narrative   Pt homeless at this time. Depressed over the fact that his girlfriend moved to [place] to live with her son because she was also homeless here in [place] as well.   Additional Social History:   Sleep: Poor  Appetite:  Fair  Current Medications: Current Facility-Administered Medications  Medication Dose Route Frequency Provider Last Rate Last Dose  . albuterol (PROVENTIL HFA;VENTOLIN HFA) 108 (90 Base) MCG/ACT inhaler 2 puff  2 puff Inhalation Q6H PRN Burnard Leigh, MD      . alum & mag hydroxide-simeth (MAALOX/MYLANTA) 200-200-20 MG/5ML suspension 30 mL  30 mL Oral Q4H PRN Beau Fanny, FNP      . benzonatate (TESSALON) capsule 200 mg  200 mg Oral TID PRN Kerry Hough, PA-C   200 mg at 11/15/16 2257  . citalopram (CELEXA) tablet 30 mg  30 mg Oral Daily Georgiann Cocker, MD   30 mg at 11/16/16 0825  . guaiFENesin (MUCINEX) 12 hr tablet 1,200 mg  1,200 mg Oral BID PRN Kerry Hough, PA-C   1,200 mg at 11/15/16 2257  . hydrOXYzine (ATARAX/VISTARIL) tablet 25 mg  25 mg Oral TID PRN Beau FannyJohn C Withrow, FNP   25 mg at 11/15/16 2125  . magnesium hydroxide (MILK OF MAGNESIA) suspension 30 mL  30 mL Oral Daily PRN Beau FannyJohn C Withrow, FNP      . nicotine (NICODERM CQ - dosed in mg/24 hours) patch 21 mg  21 mg Transdermal Daily Nelly RoutArchana Kumar, MD   21 mg at 11/16/16 0825  . traZODone (DESYREL) tablet 100 mg  100 mg Oral QHS Burnard LeighAlexander Arya Eksir, MD   100 mg at 11/15/16 2125    Lab Results: No results found for this or any previous visit (from the past 48 hour(s)).  Blood Alcohol level:  Lab Results  Component Value Date   ETH <5 11/06/2016    Metabolic Disorder Labs: No results found for: HGBA1C, MPG No results found for: PROLACTIN No results found for: CHOL, TRIG, HDL, CHOLHDL, VLDL, LDLCALC  Physical  Findings: AIMS: Facial and Oral Movements Muscles of Facial Expression: None, normal Lips and Perioral Area: None, normal Jaw: None, normal Tongue: None, normal,Extremity Movements Upper (arms, wrists, hands, fingers): None, normal Lower (legs, knees, ankles, toes): None, normal, Trunk Movements Neck, shoulders, hips: None, normal, Overall Severity Severity of abnormal movements (highest score from questions above): None, normal Incapacitation due to abnormal movements: None, normal Patient's awareness of abnormal movements (rate only patient's report): No Awareness, Dental Status Current problems with teeth and/or dentures?: No Does patient usually wear dentures?: No  CIWA:  CIWA-Ar Total: 1 COWS:  COWS Total Score: 1  Musculoskeletal: Strength & Muscle Tone: within normal limits Gait & Station: normal Patient leans: N/A  Psychiatric Specialty Exam: Physical Exam  Review of Systems  Constitutional: Negative.   HENT: Positive for congestion.   Eyes: Negative.   Respiratory: Positive for cough.   Gastrointestinal: Negative.   Genitourinary: Negative.   Musculoskeletal: Negative.   Neurological: Negative.   Endo/Heme/Allergies: Negative.   Psychiatric/Behavioral: Positive for depression. The patient has insomnia.     Blood pressure 132/71, pulse 97, temperature 98.6 F (37 C), temperature source Oral, resp. rate 16, height 5\' 7"  (1.702 m), weight 72.6 kg (160 lb), SpO2 97 %.Body mass index is 25.06 kg/m.  General Appearance: Casual  Eye Contact:  Fair  Speech:  Garbled and Normal Rate  Volume:  Normal  Mood:  Anxious  Affect:  Appropriate and Congruent  Thought Process:  Coherent and Goal Directed  Orientation:  Full (Time, Place, and Person)  Thought Content:  Logical  Suicidal Thoughts:  Yes.  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

## 2016-11-16 NOTE — Plan of Care (Signed)
Problem: Education: Goal: Knowledge of the prescribed therapeutic regimen will improve Outcome: Progressing Pt is taking his medications as prescribed. He has no side effects or complaints noted.  Problem: Health Behavior/Discharge Planning: Goal: Compliance with therapeutic regimen will improve Outcome: Progressing Pt is taking medications as prescribed.

## 2016-11-17 MED ORDER — LIDOCAINE 5 % EX PTCH
1.0000 | MEDICATED_PATCH | CUTANEOUS | Status: DC
Start: 2016-11-17 — End: 2016-11-20
  Administered 2016-11-17 – 2016-11-20 (×3): 1 via TRANSDERMAL
  Filled 2016-11-17 (×3): qty 1
  Filled 2016-11-17: qty 7
  Filled 2016-11-17 (×2): qty 1

## 2016-11-17 NOTE — Progress Notes (Signed)
Recreation Therapy Notes  Date: 11/17/16 Time: 0930 Location: 300 [address] Dayroom  Group Topic: Stress Management  Goal Area(s) Addresses:  Patient will verbalize importance of using healthy stress management.  Patient will identify positive emotions associated with healthy stress management.   Intervention: Stress Management  Activity :  Peaceful Place.  LRT introduced the stress management technique of guided imagery.  LRT read a script to allow patients to engage in a "mental vacation".  Patients were to follow along as LRT read script.  Education:  Stress Management, Discharge Planning.   Education Outcome: Acknowledges edcuation/In group clarification offered/Needs additional education  Clinical Observations/Feedback: Pt did not attend group.   Loyce Klasen, LRT/CTRS         Melvenia Favela A 11/17/2016 12:10 PM 

## 2016-11-17 NOTE — Progress Notes (Addendum)
  DATA ACTION RESPONSE  Objective- Pt. is up and visible in the milieu, seen watching TV and interacting with peers and staff approprietly. Pt. presents with an animated/anxious affect and mood. Pt. is patiently waiting for d/c pending. Subjective- Denies having any SI/HI/VH/Pain at this time. Endorses +AH stating "I hear faint voices calling my name". Not command in nature. Pt. states " I am feeling great; I have good resources". Pt. continues to be cooperative and remain safe & pleasant on the unit.  1:1 interaction in private to establish rapport. Encouragement, education, & support given from staff. Meds. ordered and administered. PRN Albuterol, Tessalon, and Vistaril requested and will re-eval accordingly.   Safety maintained with Q 15 checks. Continues to follow treatment plan and will monitor closely. No additonal questions/concerns noted.

## 2016-11-17 NOTE — [facility] Group Notes (Signed)
[facility] LCSW Group Therapy  11/17/2016 3:52 PM  Type of Therapy:  Group Therapy  Participation Level:  Active  Participation Quality:  Attentive  Affect:  Appropriate  Cognitive:  Alert and Oriented  Insight:  Engaged  Engagement in Therapy:  Improving  Modes of Intervention:  Confrontation, Discussion, Education, Problem-solving, Socialization and Support  Summary of Progress/Problems: Self Sabotage members were invited to explore various examples of self sabatoge and how this behavior affects their recovery and potential for relapse. Group members were asked to identify examples of self sabatoge in their most recent mental health/substance abuse relapse. Geoffrey Taylor was attentive and engaged during today's processing group. He shared that he often fall victim to "negative thinking or over-thinking a situation" and undermines his recovery. "I let my negativity almost destroy my relationship and pushed my girlfriend away." Geoffrey Taylor talked about how he believes he deserves to be happy and is hopeful about what the future holds for him.   Melisia Leming N Smart LCSW 11/17/2016, 3:52 PM

## 2016-11-17 NOTE — Progress Notes (Signed)
Patient did not attend AA group meeting. 

## 2016-11-17 NOTE — Progress Notes (Signed)
Nursing Note 11/17/2016 [id number]  Data Reports sleeping good with PRN sleep med.  Rates depression 1/10, hopelessness 1/10, and anxiety 1/10. Affect wide ranged and appropriate mood euthymic.  Denies HI, SI.  States auditory hallucinations better today "I just hear humming on and off."   Action Spoke with patient 1:1, nurse offered support to patient throughout shift.  Continues to be monitored on 15 minute checks for safety.  Response Remains safe and appropriate on unit.

## 2016-11-17 NOTE — Progress Notes (Signed)
[facility] MD Progress Note  11/17/2016 4:06 PM Geoffrey Taylor  MRN:  [id number]   Subjective:  Patient shares that he is feeling a little better in terms of URI symptoms. Slept fairly well last night.  His outlook is more bright and he is eager for discharge to [facility] on Monday.  He continues to be quite clear that he does not want to leave the hospital before then, as he "would be right back" in the emergency department.  He denies any active plans to harm himself, but has passive SI and the voices in his head as per usual, but endorses that they are quieter than before.  He has no other acute concerns at this time. Has participate in the milieu well and been polite/pleasant with staff on interactions.  Principal Problem: MDD (major depressive disorder), recurrent severe, without psychosis (HCC) Diagnosis:   Patient Active Problem List   Diagnosis Date Noted  . MDD (major depressive disorder), recurrent severe, without psychosis (HCC) [F33.2] 11/07/2016   Total Time spent with patient: 20 minutes  Past Psychiatric History: See H&P for full details  Past Medical History:  Past Medical History:  Diagnosis Date  . Anxiety   . Asthma   . Bipolar disorder (HCC)   . Depression   . MI (myocardial infarction)   . Pacemaker   . PTSD (post-traumatic stress disorder)     Past Surgical History:  Procedure Laterality Date  . PACEMAKER INSERTION     Family History: History reviewed. No pertinent family history. Family Psychiatric  History: See H&P for full details  Social History:  History  Alcohol Use No     History  Drug Use No    Social History   Social History  . Marital status: Unknown    Spouse name: N/A  . Number of children: N/A  . Years of education: N/A   Occupational History  . Unemployed    Social History Main Topics  . Smoking status: Current Every Day Smoker    Packs/day: 1.00    Types: Cigarettes  . Smokeless tobacco: Never Used     Comment: Patient refused   . Alcohol use No  . Drug use: No  . Sexual activity: Not Currently    Partners: Female   Other Topics Concern  . None   Social History Narrative   Pt homeless at this time. Depressed over the fact that his girlfriend moved to [place] to live with her son because she was also homeless here in [place] as well.   Additional Social History:     Sleep: Fair  Appetite:  Good  Current Medications: Current Facility-Administered Medications  Medication Dose Route Frequency Provider Last Rate Last Dose  . albuterol (PROVENTIL HFA;VENTOLIN HFA) 108 (90 Base) MCG/ACT inhaler 2 puff  2 puff Inhalation Q6H PRN Burnard Leigh, MD   2 puff at 11/17/16 0800  . alum & mag hydroxide-simeth (MAALOX/MYLANTA) 200-200-20 MG/5ML suspension 30 mL  30 mL Oral Q4H PRN Beau Fanny, FNP      . benzonatate (TESSALON) capsule 200 mg  200 mg Oral TID PRN Kerry Hough, PA-C   200 mg at 11/16/16 2124  . citalopram (CELEXA) tablet 30 mg  30 mg Oral Daily Georgiann Cocker, MD   30 mg at 11/17/16 0800  . guaiFENesin (MUCINEX) 12 hr tablet 1,200 mg  1,200 mg Oral BID PRN Kerry Hough, PA-C   1,200 mg at 11/15/16 2257  . hydrOXYzine (ATARAX/VISTARIL) tablet 25 mg  25 mg Oral TID PRN Beau FannyJohn C Withrow, FNP   25 mg at 11/16/16 2121  . magnesium hydroxide (MILK OF MAGNESIA) suspension 30 mL  30 mL Oral Daily PRN Beau FannyJohn C Withrow, FNP      . nicotine (NICODERM CQ - dosed in mg/24 hours) patch 21 mg  21 mg Transdermal Daily Nelly RoutArchana Kumar, MD   21 mg at 11/17/16 0801  . traZODone (DESYREL) tablet 100 mg  100 mg Oral QHS Burnard LeighAlexander Arya Eksir, MD   100 mg at 11/16/16 2121    Lab Results: No results found for this or any previous visit (from the past 48 hour(s)).  Blood Alcohol level:  Lab Results  Component Value Date   ETH <5 11/06/2016    Metabolic Disorder Labs: No results found for: HGBA1C, MPG No results found for: PROLACTIN No results found for: CHOL, TRIG, HDL, CHOLHDL, VLDL, LDLCALC  Physical  Findings: AIMS: Facial and Oral Movements Muscles of Facial Expression: None, normal Lips and Perioral Area: None, normal Jaw: None, normal Tongue: None, normal,Extremity Movements Upper (arms, wrists, hands, fingers): None, normal Lower (legs, knees, ankles, toes): None, normal, Trunk Movements Neck, shoulders, hips: None, normal, Overall Severity Severity of abnormal movements (highest score from questions above): None, normal Incapacitation due to abnormal movements: None, normal Patient's awareness of abnormal movements (rate only patient's report): No Awareness, Dental Status Current problems with teeth and/or dentures?: Yes Does patient usually wear dentures?: No  CIWA:  CIWA-Ar Total: 1 COWS:  COWS Total Score: 1  Musculoskeletal: Strength & Muscle Tone: within normal limits Gait & Station: normal Patient leans: N/A  Psychiatric Specialty Exam: Physical Exam  ROS  Blood pressure 108/65, pulse 87, temperature 98 F (36.7 C), temperature source Oral, resp. rate 16, height 5\' 7"  (1.702 m), weight 72.6 kg (160 lb), SpO2 97 %.Body mass index is 25.06 kg/m.  General Appearance: Bizarre and Casual  Eye Contact:  Fair  Speech:  Clear and Coherent  Volume:  Increased  Mood:  Euthymic  Affect:  Appropriate  Thought Process:  Coherent  Orientation:  Full (Time, Place, and Person)  Thought Content:  Logical  Suicidal Thoughts:  Yes.  without intent/plan  Homicidal Thoughts:  No  Memory:  Immediate;   Fair  Judgement:  Fair  Insight:  Fair  Psychomotor Activity:  Negative  Concentration:  Concentration: Fair  Recall:  NA  Fund of Knowledge:  Fair  Language:  Fair  Akathisia:  Negative  Handed:  Right  AIMS (if indicated):     Assets:  Communication Skills Desire for Improvement Social Support  ADL's:  Intact  Cognition:  WNL  Sleep:  Number of Hours: 6.5   Treatment Plan Summary: Daily contact with patient to assess and evaluate symptoms and progress in treatment  and Medication management  No change to current therapies as written for PTSD and mood symptoms Continue supportive care for URI symptoms [facility]; discharge prior to disposition plans would be very high risk for self harm or relapse/readdmission   Burnard LeighAlexander Arya Eksir, MD 11/17/2016, 4:06 PM

## 2016-11-18 NOTE — Progress Notes (Signed)
Psychoeducational Group Note  Date:  11/18/2016 Time:  2251  Group Topic/Focus:  Wrap-Up Group:   The focus of this group is to help patients review their daily goal of treatment and discuss progress on daily workbooks.  Participation Level: Did Not Attend  Participation Quality:  Not Applicable  Affect:  Not Applicable  Cognitive:  Not Applicable  Insight:  Not Applicable  Engagement in Group: Not Applicable  Additional Comments:  The patient did not attend group since he was asleep in his room.   Hazle CocaGOODMAN, Talin Feister S 11/18/2016, 10:51 PM

## 2016-11-18 NOTE — Progress Notes (Signed)
Nursing Progress Note: 7p-7a D: Pt currently presents with a pleasant/anxious affect and behavior. Pt states "i'm ready to go home, man. I'm ready to get back to a better life." Interacting appropriately with milieu. Pt reports good sleep with current medication regimen.   A: Pt provided with medications per providers orders. Pt's labs and vitals were monitored throughout the night. Pt supported emotionally and encouraged to express concerns and questions. Pt educated on medications.  R: Pt's safety ensured with 15 minute and environmental checks. Pt currently denies SI/HI/Self Harm and A/V hallucinations. Pt verbally contracts to seek staff if SI/HI or A/VH occurs and to consult with staff before acting on any harmful thoughts. Will continue to monitor.

## 2016-11-18 NOTE — Progress Notes (Signed)
Nursing Progress Note: 7p-7a D: Pt currently presents with a pleasant/happy/euthymic affect and behavior. Pt states "I feel much better now. I only hear voices call my name, but I tell to shut up and they leave. I am ready to change my life. I got a lot going on for me to be wanting to hurt myself." Interacting appropriately with milieu. Pt reports good sleep with current medication regimen.   A: Pt provided with medications per providers orders. Pt's labs and vitals were monitored throughout the night. Pt supported emotionally and encouraged to express concerns and questions. Pt educated on medications.  R: Pt's safety ensured with 15 minute and environmental checks. Pt currently denies SI/HI/Self Harm and visual hallucinations and endorses intermittent Auditory hallucinations that call his name. Pt verbally contracts to seek staff if SI/HI or A/VH occurs and to consult with staff before acting on any harmful thoughts. Will continue to monitor.

## 2016-11-18 NOTE — Progress Notes (Signed)
Data. Patient denies SI/HI/AVH. Patient interacting well with staff and other patients. No complaints. Affect is bright. On his self assessment he reports 0/10 for depression, anxiety and hopelessness. His goal for today was: "Don't lose hope and stay focused." Action. Emotional support and encouragement offered. Education provided on medication, indications and side effect. Q 15 minute checks done for safety. Response. Safety on the unit maintained through 15 minute checks.  Medications taken as prescribed. Attended groups. Remained calm and appropriate through out shift.

## 2016-11-18 NOTE — Progress Notes (Signed)
[facility]  11/18/2016 1:54 PM Geoffrey Taylor  MRN:  [id number]   Subjective: Patient reports " I am okay the lidocaine patch made my bed wet ."     Geoffrey Taylor is awake, alert and oriented *3. Seen resting in bed. Denies suicidal or homicidal ideation. Denies auditory or visual hallucination and does not appear to be responding to internal stimuli.  Patient reports he is medication compliant without mediation side effects. States  depression 5/10. Reports good appetite and reports resting well throughout the night. Support, encouragement and reassurance was provided.   Principal Problem: MDD (major depressive disorder), recurrent severe, without psychosis (HCC) Diagnosis:   Patient Active Problem List   Diagnosis Date Noted  . MDD (major depressive disorder), recurrent severe, without psychosis (HCC) [F33.2] 11/07/2016   Total Time spent with patient: 20 minutes  Past Psychiatric History: See H&P for full details  Past Medical History:  Past Medical History:  Diagnosis Date  . Anxiety   . Asthma   . Bipolar disorder (HCC)   . Depression   . MI (myocardial infarction)   . Pacemaker   . PTSD (post-traumatic stress disorder)     Past Surgical History:  Procedure Laterality Date  . PACEMAKER INSERTION     Family History: History reviewed. No pertinent family history. Family Psychiatric  History: See H&P for full details  Social History:  History  Alcohol Use No     History  Drug Use No    Social History   Social History  . Marital status: Unknown    Spouse name: N/A  . Number of children: N/A  . Years of education: N/A   Occupational History  . Unemployed    Social History Main Topics  . Smoking status: Current Every Day Smoker    Packs/day: 1.00    Types: Cigarettes  . Smokeless tobacco: Never Used     Comment: Patient refused  . Alcohol use No  . Drug use: No  . Sexual activity: Not Currently    Partners: Female   Other Topics Concern  .  None   Social History Narrative   Pt homeless at this time. Depressed over the fact that his girlfriend moved to [place] to live with her son because she was also homeless here in [place] as well.   Additional Social History:     Sleep: Fair  Appetite:  Good  Current Medications: Current Facility-Administered Medications  Medication Dose Route Frequency Provider Last Rate Last Dose  . albuterol (PROVENTIL HFA;VENTOLIN HFA) 108 (90 Base) MCG/ACT inhaler 2 puff  2 puff Inhalation Q6H PRN Burnard LeighAlexander Arya Eksir, MD   2 puff at 11/17/16 2135  . alum & mag hydroxide-simeth (MAALOX/MYLANTA) 200-200-20 MG/5ML suspension 30 mL  30 mL Oral Q4H PRN Beau FannyJohn C Withrow, FNP      . benzonatate (TESSALON) capsule 200 mg  200 mg Oral TID PRN Kerry HoughSpencer E Simon, PA-C   200 mg at 11/17/16 2134  . citalopram (CELEXA) tablet 30 mg  30 mg Oral Daily Georgiann CockerVincent A Izediuno, MD   30 mg at 11/18/16 1013  . guaiFENesin (MUCINEX) 12 hr tablet 1,200 mg  1,200 mg Oral BID PRN Kerry HoughSpencer E Simon, PA-C   1,200 mg at 11/15/16 2257  . hydrOXYzine (ATARAX/VISTARIL) tablet 25 mg  25 mg Oral TID PRN Beau FannyJohn C Withrow, FNP   25 mg at 11/17/16 2135  . lidocaine (LIDODERM) 5 % 1 patch  1 patch Transdermal Q24H Jackelyn PolingJason A Berry, NP  1 patch at 11/17/16 2304  . magnesium hydroxide (MILK OF MAGNESIA) suspension 30 mL  30 mL Oral Daily PRN Beau Fanny, FNP      . nicotine (NICODERM CQ - dosed in mg/24 hours) patch 21 mg  21 mg Transdermal Daily Nelly Rout, MD   21 mg at 11/17/16 0801  . traZODone (DESYREL) tablet 100 mg  100 mg Oral QHS Burnard Leigh, MD   100 mg at 11/17/16 2134    Lab Results: No results found for this or any previous visit (from the past 48 hour(s)).  Blood Alcohol level:  Lab Results  Component Value Date   ETH <5 11/06/2016    Metabolic Disorder Labs: No results found for: HGBA1C, MPG No results found for: PROLACTIN No results found for: CHOL, TRIG, HDL, CHOLHDL, VLDL, LDLCALC  Physical Findings: AIMS:  Facial and Oral Movements Muscles of Facial Expression: None, normal Lips and Perioral Area: None, normal Jaw: None, normal Tongue: None, normal,Extremity Movements Upper (arms, wrists, hands, fingers): None, normal Lower (legs, knees, ankles, toes): None, normal, Trunk Movements Neck, shoulders, hips: None, normal, Overall Severity Severity of abnormal movements (highest score from questions above): None, normal Incapacitation due to abnormal movements: None, normal Patient's awareness of abnormal movements (rate only patient's report): No Awareness, Dental Status Current problems with teeth and/or dentures?: No Does patient usually wear dentures?: No  CIWA:  CIWA-Ar Total: 1 COWS:  COWS Total Score: 1  Musculoskeletal: Strength & Muscle Tone: within normal limits Gait & Station: normal Patient leans: N/A  Psychiatric Specialty Exam: Physical Exam  Vitals reviewed. Constitutional: He appears well-developed and well-nourished.    Review of Systems  Musculoskeletal: Positive for back pain.  Psychiatric/Behavioral: Positive for depression and substance abuse. The patient is nervous/anxious and has insomnia.     Blood pressure 130/70, pulse 92, temperature 98.5 F (36.9 C), resp. rate 20, height 5\' 7"  (1.702 m), weight 72.6 kg (160 lb), SpO2 97 %.Body mass index is 25.06 kg/m.  General Appearance: Disheveled and Guarded  Eye Contact:  Fair  Speech:  Clear and Coherent  Volume:  Increased  Mood:  Anxious, Depressed and Dysphoric  Affect:  Appropriate  Thought Process:  Coherent  Orientation:  Full (Time, Place, and Person)  Thought Content:  Logical and Hallucinations: None  Suicidal Thoughts:  Yes.  without intent/plan  Homicidal Thoughts:  No  Memory:  Immediate;   Fair Recent;   Fair Remote;   Fair  Judgement:  Fair  Insight:  Fair  Psychomotor Activity:  Negative  Concentration:  Concentration: Fair and Attention Span: Fair  Recall:  [employer] of Knowledge:  Fair   Language:  Fair  Akathisia:  Negative  Handed:  Right  AIMS (if indicated):     Assets:  Communication Skills Desire for Improvement Physical Health Resilience Social Support  ADL's:  Intact  Cognition:  WNL  Sleep:  Number of Hours: 6.25     I agree with current treatment plan on 11/18/2016, Patient seen face-to-face for psychiatric evaluation follow-up, chart reviewed.Reviewed the information documented and agree with the treatment plan.   Treatment Plan Summary: Daily contact with patient to assess and evaluate symptoms and progress in treatment and Medication management   Continue treatment plan except where noted 11/18/2016  No change to current therapies as written for PTSD and mood symptoms  Continue Celexa 30 mg PO Q daily for mood stabilization Continue Trazodone 100 mg for insomnia   Continue supportive care for URI  symptoms Dispo to [facility] likely for Monday; discharge prior to disposition plans would be very high risk for self harm or relapse/readmission   Oneta Rack, NP 11/18/2016, 1:54 PM

## 2016-11-18 NOTE — [facility] Group Notes (Signed)
[facility] Group Notes:  (Nursing/MHT/Case Management/Adjunct)  Date:  11/18/2016  Time:  2:24 PM  Type of Therapy:  Nurse Education  Participation Level:  Did Not Attend   Almira Barenny G Leshea Jaggers 11/18/2016, 2:24 PM

## 2016-11-18 NOTE — [facility] Group Notes (Signed)
[facility] LCSW Group Therapy Note  11/18/2016  at  10:15 AM  Type of Therapy and Topic:  Group Therapy: Avoiding Self-Sabotaging and Engaging in Radical Acceptance  Participation Level:  Active  Participation Quality:  Attentive and Sharing  Affect:  Defensive  Cognitive:  Alert and Oriented  Insight:  Limited  Engagement in Therapy:  Limited   Therapeutic models used: Cognitive Behavioral Therapy,  Person-Centered Therapy and Motivational Interviewing  Modes of Intervention:  Discussion, Exploration, Orientation, Rapport Building, Socialization and Support   Summary of Progress/Problems:  The main focus of today's process group was for the patient to identify ways in which they may be able to engage in Radical Acceptance verses self sabotage. Motivational Interviewing was utilized to identify motivation they may have for wanting to change. The group processed multiple fears associated with change including the fear of success. Patient shared about his most recent health issues yet was focused on others in his life verses his own behavior.   Carney Bernatherine C Harrill, LCSW

## 2016-11-18 NOTE — Plan of Care (Signed)
Problem: Activity: Goal: Interest or engagement in leisure activities will improve Outcome: Progressing Patient has spent some time isolating in his room, but has also spent time in the milieu.

## 2016-11-19 NOTE — Plan of Care (Signed)
Problem: Activity: Goal: Imbalance in normal sleep/wake cycle will improve Outcome: Progressing Patient was able to stay out of bed all day today.

## 2016-11-19 NOTE — Progress Notes (Signed)
D: Pt was in bed in his room upon initial approach.  Pt presents with depressed affect and mood.  He reports his day was "all right" and he is "just countin' down the hours."  Pt reports he discharges tomorrow and he feels safe to do so.  [facility].  Pt denies SI/HI, denies hallucinations, denies pain.  Pt has been visible in milieu interacting with peers and staff appropriately.  Pt attended evening group.   A: Introduced self to pt.  Actively listened to pt and offered support and encouragement. Medications administered per order.  PRN medication administered for shortness of breath and cough. R: Pt is safe on the unit.  Pt is compliant with medications.  Pt verbally contracts for safety.

## 2016-11-19 NOTE — Progress Notes (Signed)
The patient attended the evening A.A.meeting and was appropriate.  

## 2016-11-19 NOTE — Progress Notes (Signed)
[facility] MD Progress Note  11/19/2016 4:32 PM Geoffrey Taylor  MRN:  [id number]   Subjective: Patient reports " Im pretty good. I feel better the medication is working. I feel calm and not as stressed out. [facility]. I am interested in doing their success program."   Per nursing:  Pt currently presents with a pleasant/anxious affect and behavior. Pt states "i'm ready to go home, man. I'm ready to get back to a better life." Interacting appropriately with milieu. Pt reports good sleep with current medication regimen.    Geoffrey Taylor is awake, alert and oriented *3. He is observed ambulating in the hallway and brightens upon approach. Currently he is reporting significant improvement since admission, and is responding well to treatment. His current treatment regimen includes celexa 30mg  po daily for depression and Trazodone 100mg  po daily qhs for insomnia. Denies suicidal or homicidal ideation. Denies auditory or visual hallucination and does not appear to be responding to internal stimuli. Patient reports he is medication compliant without mediation side effects. States depression 0/10 and anxiety 0/10 with 0 being the least and 10 being the worse. Reports good appetite and reports resting well throughout the night. Support, encouragement and reassurance was provided. He is currently awaiting placement at this time and appears that he is able to be discharged once placement is sought on the medication listed above.   Principal Problem: MDD (major depressive disorder), recurrent severe, without psychosis (HCC) Diagnosis:   Patient Active Problem List   Diagnosis Date Noted  . MDD (major depressive disorder), recurrent severe, without psychosis (HCC) [F33.2] 11/07/2016   Total Time spent with patient: 20 minutes  Past Psychiatric History: See H&P for full details  Past Medical History:  Past Medical History:  Diagnosis Date  . Anxiety   . Asthma   .  Bipolar disorder (HCC)   . Depression   . MI (myocardial infarction)   . Pacemaker   . PTSD (post-traumatic stress disorder)     Past Surgical History:  Procedure Laterality Date  . PACEMAKER INSERTION     Family History: History reviewed. No pertinent family history. Family Psychiatric  History: See H&P for full details  Social History:  History  Alcohol Use No     History  Drug Use No    Social History   Social History  . Marital status: Unknown    Spouse name: N/A  . Number of children: N/A  . Years of education: N/A   Occupational History  . Unemployed    Social History Main Topics  . Smoking status: Current Every Day Smoker    Packs/day: 1.00    Types: Cigarettes  . Smokeless tobacco: Never Used     Comment: Patient refused  . Alcohol use No  . Drug use: No  . Sexual activity: Not Currently    Partners: Female   Other Topics Concern  . None   Social History Narrative   Pt homeless at this time. Depressed over the fact that his girlfriend moved to [place] to live with her son because she was also homeless here in [place] as well.   Additional Social History:     Sleep: Fair  Appetite:  Good  Current Medications: Current Facility-Administered Medications  Medication Dose Route Frequency Provider Last Rate Last Dose  . albuterol (PROVENTIL HFA;VENTOLIN HFA) 108 (90 Base) MCG/ACT inhaler 2 puff  2 puff Inhalation Q6H PRN Burnard Leigh, MD   2 puff  at 11/17/16 2135  . alum & mag hydroxide-simeth (MAALOX/MYLANTA) 200-200-20 MG/5ML suspension 30 mL  30 mL Oral Q4H PRN Beau FannyJohn C Withrow, FNP      . benzonatate (TESSALON) capsule 200 mg  200 mg Oral TID PRN Kerry HoughSpencer E Simon, PA-C   200 mg at 11/17/16 2134  . citalopram (CELEXA) tablet 30 mg  30 mg Oral Daily Georgiann CockerVincent A Izediuno, MD   30 mg at 11/19/16 0744  . guaiFENesin (MUCINEX) 12 hr tablet 1,200 mg  1,200 mg Oral BID PRN Kerry HoughSpencer E Simon, PA-C   1,200 mg at 11/15/16 2257  . hydrOXYzine (ATARAX/VISTARIL)  tablet 25 mg  25 mg Oral TID PRN Beau FannyJohn C Withrow, FNP   25 mg at 11/18/16 2115  . lidocaine (LIDODERM) 5 % 1 patch  1 patch Transdermal Q24H Jackelyn PolingJason A Berry, NP   1 patch at 11/19/16 0744  . magnesium hydroxide (MILK OF MAGNESIA) suspension 30 mL  30 mL Oral Daily PRN Beau FannyJohn C Withrow, FNP      . nicotine (NICODERM CQ - dosed in mg/24 hours) patch 21 mg  21 mg Transdermal Daily Nelly RoutArchana Kumar, MD   21 mg at 11/17/16 0801  . traZODone (DESYREL) tablet 100 mg  100 mg Oral QHS Burnard LeighAlexander Arya Eksir, MD   100 mg at 11/18/16 2115    Lab Results: No results found for this or any previous visit (from the past 48 hour(s)).  Blood Alcohol level:  Lab Results  Component Value Date   ETH <5 11/06/2016    Metabolic Disorder Labs: No results found for: HGBA1C, MPG No results found for: PROLACTIN No results found for: CHOL, TRIG, HDL, CHOLHDL, VLDL, LDLCALC  Physical Findings: AIMS: Facial and Oral Movements Muscles of Facial Expression: None, normal Lips and Perioral Area: None, normal Jaw: None, normal Tongue: None, normal,Extremity Movements Upper (arms, wrists, hands, fingers): None, normal Lower (legs, knees, ankles, toes): None, normal, Trunk Movements Neck, shoulders, hips: None, normal, Overall Severity Severity of abnormal movements (highest score from questions above): None, normal Incapacitation due to abnormal movements: None, normal Patient's awareness of abnormal movements (rate only patient's report): No Awareness, Dental Status Current problems with teeth and/or dentures?: No Does patient usually wear dentures?: No  CIWA:  CIWA-Ar Total: 1 COWS:  COWS Total Score: 1  Musculoskeletal: Strength & Muscle Tone: within normal limits Gait & Station: normal Patient leans: N/A  Psychiatric Specialty Exam: Physical Exam  Vitals reviewed. Constitutional: He appears well-developed and well-nourished.    Review of Systems  Musculoskeletal: Positive for back pain.   Psychiatric/Behavioral: Positive for depression and substance abuse. The patient is nervous/anxious and has insomnia.     Blood pressure 112/64, pulse 83, temperature 97.9 F (36.6 C), resp. rate 16, height 5\' 7"  (1.702 m), weight 72.6 kg (160 lb), SpO2 97 %.Body mass index is 25.06 kg/m.  General Appearance: Casual and Fairly Groomed  Eye Contact:  Fair  Speech:  Clear and Coherent  Volume:  Normal  Mood:  Euthymic  Affect:  Appropriate  Thought Process:  Coherent  Orientation:  Full (Time, Place, and Person)  Thought Content:  Logical  Suicidal Thoughts:  No  Homicidal Thoughts:  No  Memory:  Immediate;   Fair Recent;   Fair Remote;   Fair  Judgement:  Fair  Insight:  Fair  Psychomotor Activity:  Negative  Concentration:  Concentration: Fair and Attention Span: Fair  Recall:  [employer]Fair  Fund of Knowledge:  Fair  Language:  Fair  Akathisia:  Negative  Handed:  Right  AIMS (if indicated):     Assets:  Communication Skills Desire for Improvement Physical Health Resilience Social Support  ADL's:  Intact  Cognition:  WNL  Sleep:  Number of Hours: 6.5     I agree with current treatment plan on 11/18/2016, Patient seen face-to-face for psychiatric evaluation follow-up, chart reviewed.Reviewed the information documented and agree with the treatment plan.   Treatment Plan Summary: Daily contact with patient to assess and evaluate symptoms and progress in treatment and Medication management   Continue treatment plan except where noted 11/18/2016  No change to current therapies as written for PTSD and mood symptoms  Continue Celexa 30 mg PO Q daily for mood stabilization Continue Trazodone 100 mg for insomnia   Continue supportive care for URI symptoms Dispo to [facility] likely for Monday; discharge prior to disposition plans would be very high risk for self harm or relapse/readmission   Truman Hayward, FNP 11/19/2016, 4:32 PM

## 2016-11-19 NOTE — Progress Notes (Signed)
Data. Patient denies SI/HI/AVH. Patient interacting well with staff and other patients. Affect is bright and patient reports that, "I am looking forward to being able to get out of here tomorrow. I am ready." On his self assessment patient reports 0/10 for depression, 1/10 for hopelessness and 3/10 for anxiety. He has no goal today. Patient was noted entertaining his peers with his excellent singing. Affect bright throughout this "performance." Action. Emotional support and encouragement offered. Education provided on medication, indications and side effect. Q 15 minute checks done for safety. Response. Safety on the unit maintained through 15 minute checks.  Medications taken as prescribed. Remained calm and appropriate through out shift.

## 2016-11-19 NOTE — [facility] Counselor (Signed)
[facility] LCSW Group Therapy Note   11/19/2016  10:15  AM   Type of Therapy and Topic: Group Therapy: Feelings Around Returning Home & Establishing a Supportive Framework and Activity to Identify signs of Improvement or Decompensation   Participation Level: Active   Description of Group:  Patients first processed thoughts and feelings about up coming discharge. These included fears of upcoming changes, lack of change, new living environments, judgements and expectations from others and overall stigma of MH issues. We then discussed what is a supportive framework? What does it look like feel like and how do I discern it from and unhealthy non-supportive network? Learn how to cope when supports are not helpful and don't support you. Discuss what to do when your family/friends are not supportive.   Therapeutic Goals Addressed in Processing Group:  1. Patient will identify one healthy supportive network that they can use at discharge. 2. Patient will identify one factor of a supportive framework and how to tell it from an unhealthy network. 3. Patient able to identify one coping skill to use when they do not have positive supports from others. 4. Patient will demonstrate ability to communicate their needs through discussion and/or role plays.  Summary of Patient Progress:  Pt engages easily yet needs some redirection to avoid monopolizing during group session. As patients processed their anxiety about discharge and described healthy supports patient spoke at length about his desire for supports to listen.  Patient chose a visual to represent decompensation as [place]ness [place]  Carney Bernatherine C Harrill, LCSW

## 2016-11-20 MED ORDER — TRAZODONE HCL 100 MG PO TABS: 100.0000 mg | ORAL_TABLET | Freq: Every day | ORAL | 0 refills | Status: AC

## 2016-11-20 MED ORDER — ALBUTEROL SULFATE HFA 108 (90 BASE) MCG/ACT IN AERS: 2.0000 | INHALATION_SPRAY | Freq: Four times a day (QID) | RESPIRATORY_TRACT | Status: AC | PRN

## 2016-11-20 MED ORDER — NICOTINE 21 MG/24HR TD PT24: 21.0000 mg | MEDICATED_PATCH | Freq: Every day | TRANSDERMAL | 0 refills | Status: AC

## 2016-11-20 MED ORDER — CITALOPRAM HYDROBROMIDE 10 MG PO TABS
30.0000 mg | ORAL_TABLET | Freq: Every day | ORAL | Status: DC
  Filled 2016-11-20 (×2): qty 21

## 2016-11-20 MED ORDER — CITALOPRAM HYDROBROMIDE 10 MG PO TABS: 30.0000 mg | ORAL_TABLET | Freq: Every day | ORAL | 0 refills | Status: AC

## 2016-11-20 MED ORDER — LIDOCAINE 5 % EX PTCH: 1.0000 | MEDICATED_PATCH | CUTANEOUS | 0 refills | Status: AC

## 2016-11-20 NOTE — Progress Notes (Signed)
Discharge Note:  [facility].  Patient denied SI and HI.  Denied A/V hallucinations.  Suicide prevention information given and discussed with patient who stated he understood and had no questions.  Patient stated he received all his belongings, clothing, toiletries, misc items, prescriptions.  All required discharge information given to patient at discharge.
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
## 2016-11-20 NOTE — [facility] Group Notes (Signed)
Geoffrey Taylor was invited to attend group.   Did not attend.     Pt attended spiritual care group on grief and loss facilitated by chaplain Geoffrey Taylor   Group opened with brief discussion and psycho-social ed around grief and loss in relationships and in relation to self - identifying life patterns, circumstances, changes that cause losses. Established group norm of speaking from own life experience. Group goal of establishing open and affirming space for members to share loss and experience with grief, normalize grief experience and provide psycho social education and grief support.
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
## 2016-11-20 NOTE — Progress Notes (Signed)
Recreation Therapy Notes  Date: 11/20/16 Time: 0930 Location: 300 Hall Group Room  Group Topic: Stress Management  Goal Area(s) Addresses:  Patient will verbalize importance of using healthy stress management.  Patient will identify positive emotions associated with healthy stress management.   Intervention: Stress Management  Activity :  Meditation.  LRT played a meditation on how to deal with stress from the Calm app.  Patients were to follow along as the meditation played.  Education:  Stress Management, Discharge Planning.   Education Outcome: Acknowledges edcuation/In group clarification offered/Needs additional education  Clinical Observations/Feedback: Pt did not attend group.   Caroll RancherMarjette Nevan Creighton, LRT/CTRS         Lillia AbedLindsay, Dasani Crear A 11/20/2016 1:10 PM

## 2016-11-20 NOTE — Progress Notes (Signed)
D:  Patient's self inventory sheet, patient sleeps good, sleep medication is helpful.  Fair appetite, normal energy level, good concentration.  Denied depression, hopeless and anxiety.  Denied withdrawals.  Denied SI.  Physical problems, back pain, pain medication is helpful.  Goal is to discharge.  Does have discharge plans. A:  Medications administered per MD orders.  Emotional support and encouragement given patient. R:  Patient denied SI and HI, contracts for safety.  Denied A/V hallucinations.  Safety maintained with 15 minute checks. Patient's lidoderm patch was applied to back this morning per MD approval.  Patient refused patch at 2300 last night.

## 2016-11-20 NOTE — Progress Notes (Addendum)
  [facility] Discharge Plan :  Will you be returning to the same living situation after discharge:  No. [facility] At discharge, do you have transportation home?: Yes, pt will be picked up by [facility]. Do you have the ability to pay for your medications: Yes,  pt provided with samples and prescriptions.  Release of information consent forms completed and in the chart;  Patient's signature needed at discharge.  Patient to Follow up at: Follow-up Information    [facility] Follow up.   Specialty:  Behavioral Health Why:  Walk In,  within 7 days of your discharge.  Open Monday - Friday 8:30am - 5:00pm Contact information: [address] [place] [address] [phone]           Next level of care provider [facility]:yes  Safety Planning and Suicide Prevention discussed: Yes,  discussed with the pt.  Have you used any form of tobacco in the last 30 days? (Cigarettes, Smokeless Tobacco, Cigars, and/or Pipes): Yes  Has patient been referred to the Quitline?: Patient refused referral  Patient has been referred for addiction treatment: Yes  Jonathon JordanLynn B Deshon Koslowski 11/20/2016, 11:20 AM

## 2017-06-27 IMAGING — CR DG CHEST 2V
2 series · 2 of 2 positions shown · non-contrast
Comparison: None.

CLINICAL DATA: Central chest pain.

EXAM:
CHEST  2 VIEW

[chest pa]
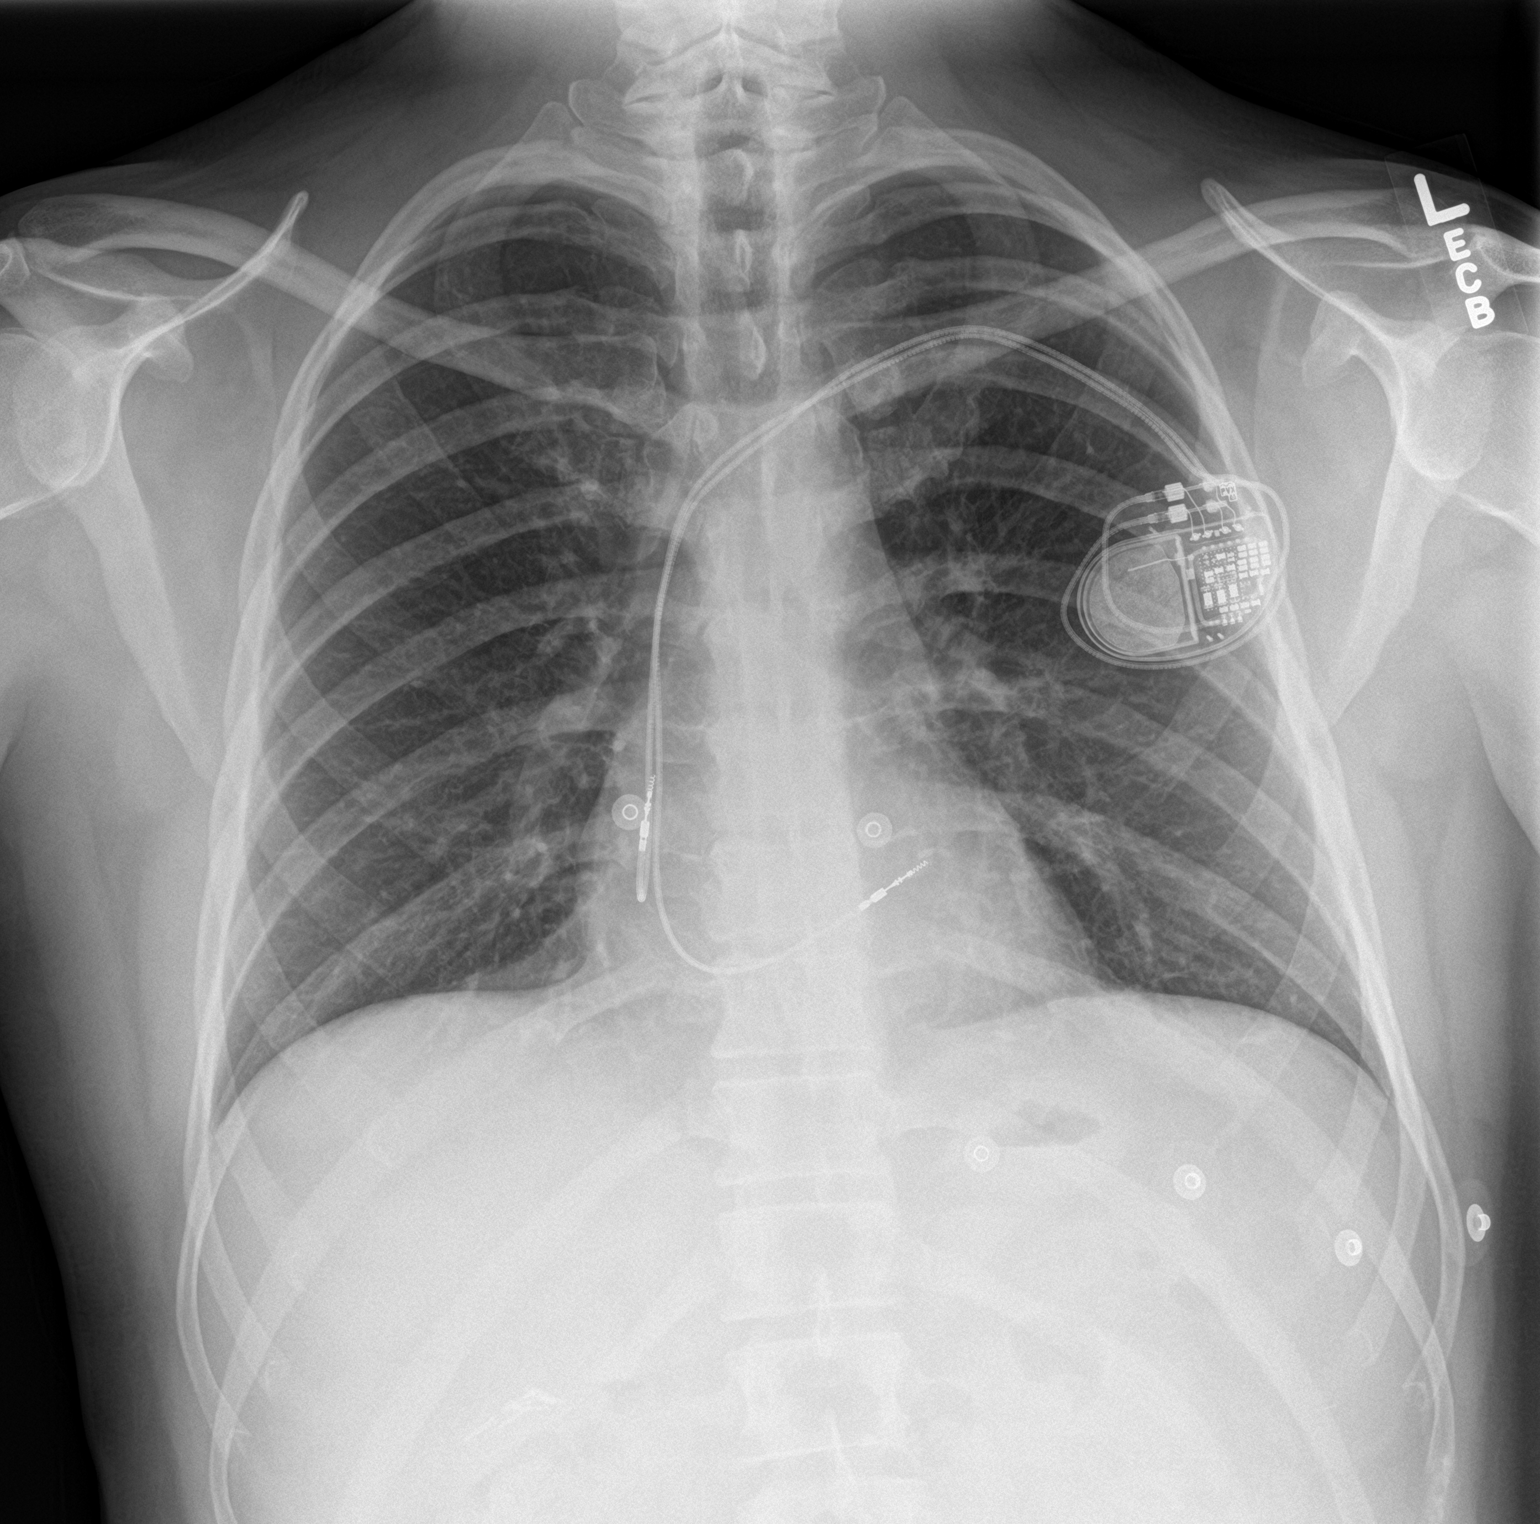

[chest lat]
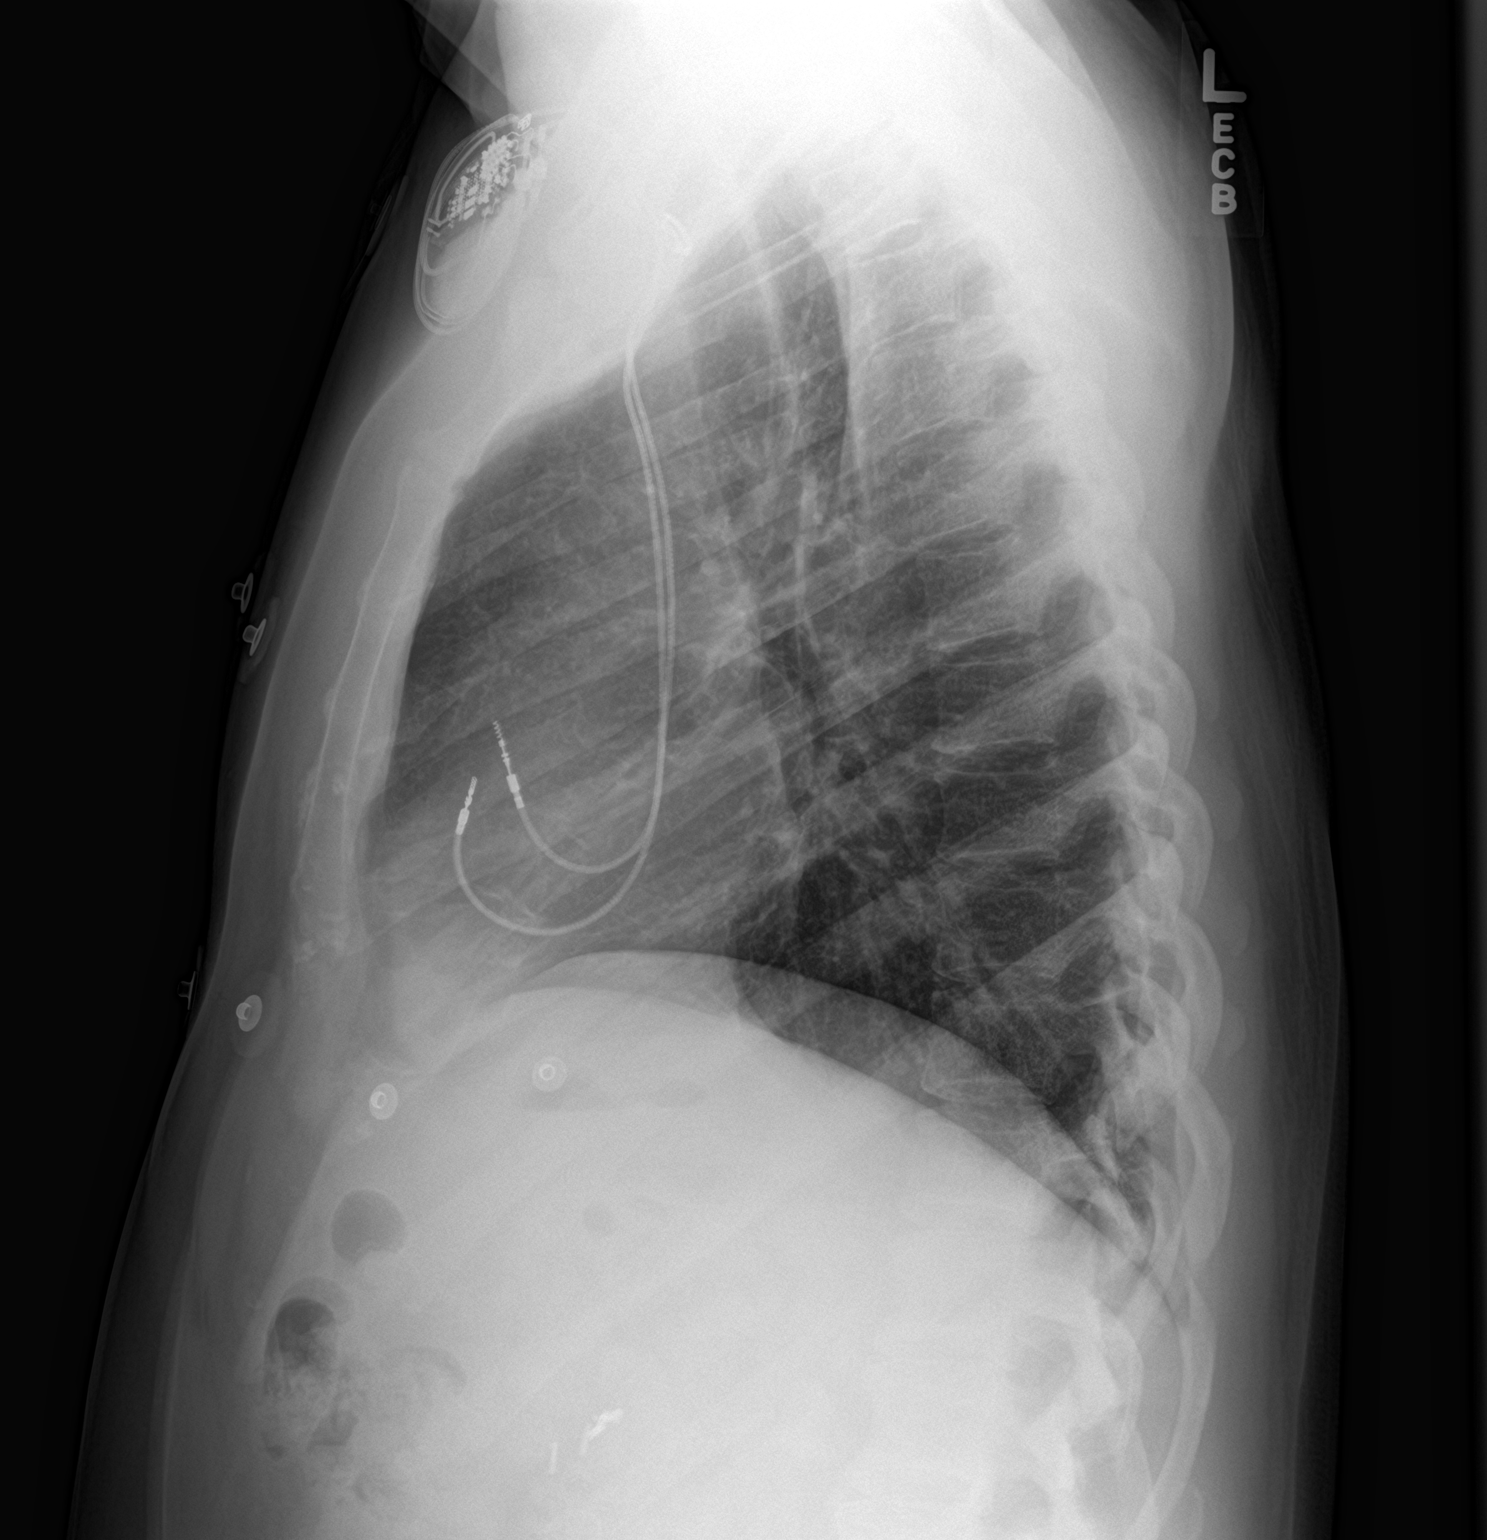

[2 of 2 positions shown; findings below may reference images not displayed]

FINDINGS: Trachea is midline. Pacemaker lead tips are in the right atrium and
right ventricle. Lungs are clear. No pleural fluid.
IMPRESSION: No acute findings.
